# Patient Record
Sex: Male | Born: 1985 | Race: Black or African American | Hispanic: No | Marital: Single | Smoking: Current every day smoker
Health system: Southern US, Community
[De-identification: ages and names within clinical notes are randomized; demographics above are authoritative.]

## PROBLEM LIST (undated history)

## (undated) HISTORY — PX: HERNIA REPAIR: SHX51

## (undated) HISTORY — PX: APPENDECTOMY: SHX54

---
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
## 2006-08-24 ENCOUNTER — Emergency Department (HOSPITAL_COMMUNITY): Admission: EM | Admit: 2006-08-24 | Discharge: 2006-08-24 | Payer: Self-pay | Admitting: Emergency Medicine

## 2006-11-30 ENCOUNTER — Emergency Department (HOSPITAL_COMMUNITY): Admission: EM | Admit: 2006-11-30 | Discharge: 2006-11-30 | Payer: Self-pay | Admitting: Emergency Medicine

## 2006-12-04 ENCOUNTER — Ambulatory Visit (HOSPITAL_COMMUNITY): Admission: RE | Admit: 2006-12-04 | Discharge: 2006-12-04 | Payer: Self-pay | Admitting: Family Medicine

## 2007-08-24 ENCOUNTER — Emergency Department (HOSPITAL_COMMUNITY): Admission: EM | Admit: 2007-08-24 | Discharge: 2007-08-24 | Payer: Self-pay | Admitting: Emergency Medicine

## 2011-08-31 ENCOUNTER — Encounter: Payer: Self-pay | Admitting: Gastroenterology

## 2011-09-26 ENCOUNTER — Ambulatory Visit: Payer: Self-pay | Admitting: Gastroenterology

## 2012-04-11 ENCOUNTER — Emergency Department (HOSPITAL_COMMUNITY): Payer: No Typology Code available for payment source

## 2012-04-11 ENCOUNTER — Encounter (HOSPITAL_COMMUNITY): Payer: Self-pay | Admitting: *Deleted

## 2012-04-11 ENCOUNTER — Emergency Department (HOSPITAL_COMMUNITY)
Admission: EM | Admit: 2012-04-11 | Discharge: 2012-04-11 | Disposition: A | Discharge status: Home / Self Care | Payer: No Typology Code available for payment source | Attending: Emergency Medicine | Admitting: Emergency Medicine

## 2012-04-11 DIAGNOSIS — S8010XA Contusion of unspecified lower leg, initial encounter: Secondary | ICD-10-CM

## 2012-04-11 DIAGNOSIS — F17200 Nicotine dependence, unspecified, uncomplicated: Secondary | ICD-10-CM | POA: Insufficient documentation

## 2012-04-11 DIAGNOSIS — Y93I9 Activity, other involving external motion: Secondary | ICD-10-CM | POA: Insufficient documentation

## 2012-04-11 DIAGNOSIS — Y92410 Unspecified street and highway as the place of occurrence of the external cause: Secondary | ICD-10-CM | POA: Insufficient documentation

## 2012-04-11 DIAGNOSIS — S8990XA Unspecified injury of unspecified lower leg, initial encounter: Secondary | ICD-10-CM | POA: Insufficient documentation

## 2012-04-11 DIAGNOSIS — S139XXA Sprain of joints and ligaments of unspecified parts of neck, initial encounter: Secondary | ICD-10-CM | POA: Insufficient documentation

## 2012-04-11 DIAGNOSIS — S161XXA Strain of muscle, fascia and tendon at neck level, initial encounter: Secondary | ICD-10-CM

## 2012-04-11 MED ORDER — HYDROCODONE-ACETAMINOPHEN 5-325 MG PO TABS: 1.0000 | ORAL_TABLET | ORAL | Status: DC | PRN

## 2012-04-11 MED ORDER — IBUPROFEN 800 MG PO TABS
800.0000 mg | ORAL_TABLET | Freq: Once | ORAL | Status: AC
  Administered 2012-04-11: 800 mg via ORAL
  Filled 2012-04-11: qty 1
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
## 2012-04-11 NOTE — ED Notes (Signed)
Patient with no complaints at this time. Respirations even and unlabored. Skin warm/dry. Discharge instructions reviewed with patient at this time. Patient given opportunity to voice concerns/ask questions. Patient discharged at this time and left Emergency Department with steady gait.   
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
## 2012-05-04 ENCOUNTER — Emergency Department (HOSPITAL_COMMUNITY)
Admission: EM | Admit: 2012-05-04 | Discharge: 2012-05-04 | Disposition: A | Discharge status: Home / Self Care | Payer: No Typology Code available for payment source | Attending: Emergency Medicine | Admitting: Emergency Medicine

## 2012-05-04 ENCOUNTER — Emergency Department (HOSPITAL_COMMUNITY): Payer: No Typology Code available for payment source

## 2012-05-04 ENCOUNTER — Encounter (HOSPITAL_COMMUNITY): Payer: Self-pay | Admitting: *Deleted

## 2012-05-04 DIAGNOSIS — S335XXA Sprain of ligaments of lumbar spine, initial encounter: Secondary | ICD-10-CM | POA: Insufficient documentation

## 2012-05-04 DIAGNOSIS — N50819 Testicular pain, unspecified: Secondary | ICD-10-CM

## 2012-05-04 DIAGNOSIS — Y92410 Unspecified street and highway as the place of occurrence of the external cause: Secondary | ICD-10-CM | POA: Insufficient documentation

## 2012-05-04 DIAGNOSIS — N5089 Other specified disorders of the male genital organs: Secondary | ICD-10-CM | POA: Insufficient documentation

## 2012-05-04 DIAGNOSIS — F17200 Nicotine dependence, unspecified, uncomplicated: Secondary | ICD-10-CM | POA: Insufficient documentation

## 2012-05-04 DIAGNOSIS — M79609 Pain in unspecified limb: Secondary | ICD-10-CM | POA: Insufficient documentation

## 2012-05-04 DIAGNOSIS — S39012A Strain of muscle, fascia and tendon of lower back, initial encounter: Secondary | ICD-10-CM

## 2012-05-04 DIAGNOSIS — N509 Disorder of male genital organs, unspecified: Secondary | ICD-10-CM | POA: Insufficient documentation

## 2012-05-04 DIAGNOSIS — Y939 Activity, unspecified: Secondary | ICD-10-CM | POA: Insufficient documentation

## 2012-05-04 MED ORDER — TRAMADOL HCL 50 MG PO TABS: 50.0000 mg | ORAL_TABLET | Freq: Four times a day (QID) | ORAL | Status: DC | PRN

## 2012-05-04 MED ORDER — IBUPROFEN 800 MG PO TABS: 800.0000 mg | ORAL_TABLET | Freq: Three times a day (TID) | ORAL | Status: DC

## 2012-05-04 MED ORDER — SULFAMETHOXAZOLE-TRIMETHOPRIM 800-160 MG PO TABS: 1.0000 | ORAL_TABLET | Freq: Two times a day (BID) | ORAL | Status: DC
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
## 2013-04-02 ENCOUNTER — Encounter (HOSPITAL_COMMUNITY)
Admission: EM | Disposition: A | Discharge status: Home / Self Care | Payer: Self-pay | Source: Home / Self Care | Attending: Emergency Medicine

## 2013-04-02 ENCOUNTER — Emergency Department (HOSPITAL_COMMUNITY): Payer: Self-pay

## 2013-04-02 ENCOUNTER — Emergency Department (HOSPITAL_COMMUNITY): Payer: Self-pay | Admitting: Anesthesiology

## 2013-04-02 ENCOUNTER — Encounter (HOSPITAL_COMMUNITY): Payer: Self-pay | Admitting: Emergency Medicine

## 2013-04-02 ENCOUNTER — Observation Stay (HOSPITAL_COMMUNITY)
Admission: EM | Admit: 2013-04-02 | Discharge: 2013-04-03 | Disposition: A | Discharge status: Home / Self Care | Payer: Self-pay | Attending: General Surgery | Admitting: General Surgery

## 2013-04-02 ENCOUNTER — Encounter (HOSPITAL_COMMUNITY): Payer: Self-pay | Admitting: Anesthesiology

## 2013-04-02 DIAGNOSIS — K3580 Unspecified acute appendicitis: Principal | ICD-10-CM | POA: Diagnosis present

## 2013-04-02 DIAGNOSIS — R109 Unspecified abdominal pain: Secondary | ICD-10-CM

## 2013-04-02 DIAGNOSIS — R1110 Vomiting, unspecified: Secondary | ICD-10-CM

## 2013-04-02 DIAGNOSIS — Z01812 Encounter for preprocedural laboratory examination: Secondary | ICD-10-CM | POA: Insufficient documentation

## 2013-04-02 HISTORY — PX: LAPAROSCOPIC APPENDECTOMY: SHX408

## 2013-04-02 LAB — COMPREHENSIVE METABOLIC PANEL
ALT: 20 U/L (ref 0–53)
AST: 26 U/L (ref 0–37)
Alkaline Phosphatase: 77 U/L (ref 39–117)
CO2: 25 mEq/L (ref 19–32)
Calcium: 10.3 mg/dL (ref 8.4–10.5)
GFR calc non Af Amer: >90 mL/min (ref >90)
Potassium: 3.5 mEq/L (ref 3.5–5.1)
Sodium: 140 mEq/L (ref 135–145)

## 2013-04-02 LAB — CBC WITH DIFFERENTIAL/PLATELET
Basophils Absolute: 0 10*3/uL (ref 0.0–0.1)
Eosinophils Relative: 1 % (ref 0–5)
Lymphocytes Relative: 9 % — ABNORMAL LOW (ref 12–46)
Neutro Abs: 11.6 10*3/uL — ABNORMAL HIGH (ref 1.7–7.7)
Neutrophils Relative %: 82 % — ABNORMAL HIGH (ref 43–77)
Platelets: 194 10*3/uL (ref 150–400)
RBC: 4.4 MIL/uL (ref 4.22–5.81)
RDW: 13.4 % (ref 11.5–15.5)
WBC: 14.1 10*3/uL — ABNORMAL HIGH (ref 4.0–10.5)

## 2013-04-02 LAB — URINALYSIS, ROUTINE W REFLEX MICROSCOPIC
Glucose, UA: NEGATIVE mg/dL
Hgb urine dipstick: NEGATIVE
Protein, ur: NEGATIVE mg/dL
Specific Gravity, Urine: 1.02 (ref 1.005–1.030)

## 2013-04-02 SURGERY — APPENDECTOMY, LAPAROSCOPIC
Anesthesia: General | Site: Abdomen | Wound class: Dirty or Infected

## 2013-04-02 MED ORDER — ONDANSETRON HCL 4 MG/2ML IJ SOLN
4.0000 mg | Freq: Once | INTRAMUSCULAR | Status: AC
  Administered 2013-04-02: 4 mg via INTRAVENOUS
  Filled 2013-04-02: qty 2

## 2013-04-02 MED ORDER — PROPOFOL 10 MG/ML IV BOLUS
INTRAVENOUS | Status: DC | PRN
  Administered 2013-04-02: 25 mg via INTRAVENOUS
  Administered 2013-04-02: 175 mg via INTRAVENOUS

## 2013-04-02 MED ORDER — DEXTROSE 5 % IV SOLN: 2.0000 g | INTRAVENOUS | Status: DC

## 2013-04-02 MED ORDER — NEOSTIGMINE METHYLSULFATE 1 MG/ML IJ SOLN
INTRAMUSCULAR | Status: DC | PRN
  Administered 2013-04-02: 3 mg via INTRAVENOUS

## 2013-04-02 MED ORDER — SODIUM CHLORIDE 0.9 % IJ SOLN
INTRAMUSCULAR | Status: AC
  Filled 2013-04-02: qty 500

## 2013-04-02 MED ORDER — OXYCODONE-ACETAMINOPHEN 5-325 MG PO TABS
1.0000 | ORAL_TABLET | ORAL | Status: DC | PRN
  Administered 2013-04-02 – 2013-04-03 (×3): 2 via ORAL
  Filled 2013-04-02 (×3): qty 2

## 2013-04-02 MED ORDER — BUPIVACAINE HCL (PF) 0.5 % IJ SOLN
INTRAMUSCULAR | Status: DC | PRN
  Administered 2013-04-02: 10 mL

## 2013-04-02 MED ORDER — MIDAZOLAM HCL 2 MG/2ML IJ SOLN
INTRAMUSCULAR | Status: AC
  Filled 2013-04-02: qty 2

## 2013-04-02 MED ORDER — SODIUM CHLORIDE 0.9 % IV BOLUS (SEPSIS)
1000.0000 mL | Freq: Once | INTRAVENOUS | Status: AC
  Administered 2013-04-02: 1000 mL via INTRAVENOUS

## 2013-04-02 MED ORDER — DEXTROSE 5 % IV SOLN
1.0000 g | Freq: Two times a day (BID) | INTRAVENOUS | Status: DC
  Administered 2013-04-02 – 2013-04-03 (×2): 1 g via INTRAVENOUS
  Filled 2013-04-02 (×4): qty 1

## 2013-04-02 MED ORDER — HYDROMORPHONE HCL PF 1 MG/ML IJ SOLN: 1.0000 mg | INTRAMUSCULAR | Status: DC | PRN

## 2013-04-02 MED ORDER — ONDANSETRON HCL 4 MG PO TABS: 4.0000 mg | ORAL_TABLET | Freq: Four times a day (QID) | ORAL | Status: DC | PRN

## 2013-04-02 MED ORDER — ROCURONIUM BROMIDE 100 MG/10ML IV SOLN
INTRAVENOUS | Status: DC | PRN
  Administered 2013-04-02: 25 mg via INTRAVENOUS
  Administered 2013-04-02: 5 mg via INTRAVENOUS

## 2013-04-02 MED ORDER — SODIUM CHLORIDE 0.9 % IR SOLN
Status: DC | PRN
  Administered 2013-04-02: 1000 mL

## 2013-04-02 MED ORDER — SUCCINYLCHOLINE CHLORIDE 20 MG/ML IJ SOLN
INTRAMUSCULAR | Status: AC
  Filled 2013-04-02: qty 1

## 2013-04-02 MED ORDER — ONDANSETRON HCL 4 MG/2ML IJ SOLN: 4.0000 mg | Freq: Four times a day (QID) | INTRAMUSCULAR | Status: DC | PRN

## 2013-04-02 MED ORDER — METOCLOPRAMIDE HCL 5 MG/ML IJ SOLN
10.0000 mg | Freq: Once | INTRAMUSCULAR | Status: AC
  Administered 2013-04-02: 10 mg via INTRAVENOUS
  Filled 2013-04-02: qty 2

## 2013-04-02 MED ORDER — ONDANSETRON HCL 4 MG/2ML IJ SOLN
4.0000 mg | Freq: Once | INTRAMUSCULAR | Status: AC
  Administered 2013-04-02: 4 mg via INTRAMUSCULAR
  Filled 2013-04-02: qty 2

## 2013-04-02 MED ORDER — GLYCOPYRROLATE 0.2 MG/ML IJ SOLN
INTRAMUSCULAR | Status: DC | PRN
  Administered 2013-04-02: .6 mg via INTRAVENOUS

## 2013-04-02 MED ORDER — IOHEXOL 300 MG/ML  SOLN
50.0000 mL | Freq: Once | INTRAMUSCULAR | Status: AC | PRN
  Administered 2013-04-02: 50 mL via ORAL

## 2013-04-02 MED ORDER — PROPOFOL 10 MG/ML IV EMUL
INTRAVENOUS | Status: AC
  Filled 2013-04-02: qty 20

## 2013-04-02 MED ORDER — ENOXAPARIN SODIUM 40 MG/0.4ML ~~LOC~~ SOLN
40.0000 mg | SUBCUTANEOUS | Status: DC
  Administered 2013-04-02: 40 mg via SUBCUTANEOUS
  Filled 2013-04-02: qty 0.4

## 2013-04-02 MED ORDER — FENTANYL CITRATE 0.05 MG/ML IJ SOLN
INTRAMUSCULAR | Status: AC
  Filled 2013-04-02: qty 5

## 2013-04-02 MED ORDER — FENTANYL CITRATE 0.05 MG/ML IJ SOLN
INTRAMUSCULAR | Status: DC | PRN
  Administered 2013-04-02: 50 ug via INTRAVENOUS
  Administered 2013-04-02: 100 ug via INTRAVENOUS
  Administered 2013-04-02 (×2): 25 ug via INTRAVENOUS
  Administered 2013-04-02 (×5): 50 ug via INTRAVENOUS

## 2013-04-02 MED ORDER — LACTATED RINGERS IV SOLN
INTRAVENOUS | Status: DC
  Administered 2013-04-02 (×2): via INTRAVENOUS

## 2013-04-02 MED ORDER — DEXTROSE 5 % IV SOLN
2.0000 g | INTRAVENOUS | Status: AC
  Administered 2013-04-02: 2 g via INTRAVENOUS
  Filled 2013-04-02: qty 2

## 2013-04-02 MED ORDER — PROMETHAZINE HCL 25 MG RE SUPP: 25.0000 mg | Freq: Four times a day (QID) | RECTAL | Status: DC | PRN

## 2013-04-02 MED ORDER — ROCURONIUM BROMIDE 50 MG/5ML IV SOLN
INTRAVENOUS | Status: AC
  Filled 2013-04-02: qty 1

## 2013-04-02 MED ORDER — NICOTINE 21 MG/24HR TD PT24
21.0000 mg | MEDICATED_PATCH | TRANSDERMAL | Status: DC
  Administered 2013-04-02: 21 mg via TRANSDERMAL
  Filled 2013-04-02: qty 1

## 2013-04-02 MED ORDER — POVIDONE-IODINE 10 % EX OINT
TOPICAL_OINTMENT | CUTANEOUS | Status: AC
  Filled 2013-04-02: qty 1

## 2013-04-02 MED ORDER — LIDOCAINE HCL (PF) 1 % IJ SOLN
INTRAMUSCULAR | Status: AC
  Filled 2013-04-02: qty 5

## 2013-04-02 MED ORDER — LACTATED RINGERS IV SOLN
INTRAVENOUS | Status: DC | PRN
  Administered 2013-04-02: 18:00:00 via INTRAVENOUS

## 2013-04-02 MED ORDER — KETOROLAC TROMETHAMINE 30 MG/ML IJ SOLN
30.0000 mg | Freq: Once | INTRAMUSCULAR | Status: AC
  Administered 2013-04-02: 30 mg via INTRAVENOUS

## 2013-04-02 MED ORDER — BUPIVACAINE HCL (PF) 0.5 % IJ SOLN
INTRAMUSCULAR | Status: AC
  Filled 2013-04-02: qty 30

## 2013-04-02 MED ORDER — TRAMADOL HCL 50 MG PO TABS: 50.0000 mg | ORAL_TABLET | Freq: Four times a day (QID) | ORAL | Status: DC | PRN

## 2013-04-02 MED ORDER — FENTANYL CITRATE 0.05 MG/ML IJ SOLN
INTRAMUSCULAR | Status: AC
  Filled 2013-04-02: qty 2

## 2013-04-02 MED ORDER — MIDAZOLAM HCL 5 MG/5ML IJ SOLN
INTRAMUSCULAR | Status: DC | PRN
  Administered 2013-04-02: 2 mg via INTRAVENOUS

## 2013-04-02 MED ORDER — FENTANYL CITRATE 0.05 MG/ML IJ SOLN
INTRAMUSCULAR | Status: AC
Start: 2013-04-02 — End: 2013-04-02
  Filled 2013-04-02: qty 2

## 2013-04-02 MED ORDER — MORPHINE SULFATE 4 MG/ML IJ SOLN
4.0000 mg | Freq: Once | INTRAMUSCULAR | Status: AC
  Administered 2013-04-02: 4 mg via INTRAVENOUS
  Filled 2013-04-02: qty 1

## 2013-04-02 MED ORDER — SUCCINYLCHOLINE CHLORIDE 20 MG/ML IJ SOLN
INTRAMUSCULAR | Status: DC | PRN
  Administered 2013-04-02: 120 mg via INTRAVENOUS

## 2013-04-02 MED ORDER — IOHEXOL 300 MG/ML  SOLN
100.0000 mL | Freq: Once | INTRAMUSCULAR | Status: AC | PRN
  Administered 2013-04-02: 100 mL via INTRAVENOUS

## 2013-04-02 MED ORDER — GLYCOPYRROLATE 0.2 MG/ML IJ SOLN
INTRAMUSCULAR | Status: AC
  Filled 2013-04-02: qty 3

## 2013-04-02 MED ORDER — KETOROLAC TROMETHAMINE 30 MG/ML IJ SOLN
INTRAMUSCULAR | Status: AC
  Filled 2013-04-02: qty 1

## 2013-04-02 MED ORDER — LIDOCAINE HCL 1 % IJ SOLN
INTRAMUSCULAR | Status: DC | PRN
  Administered 2013-04-02 (×2): 50 mg via INTRADERMAL

## 2013-04-02 SURGICAL SUPPLY — 38 items
BAG HAMPER (MISCELLANEOUS) ×2 IMPLANT
CLOTH BEACON ORANGE TIMEOUT ST (SAFETY) ×2 IMPLANT
COVER LIGHT HANDLE STERIS (MISCELLANEOUS) ×4 IMPLANT
CUTTER LINEAR ENDO 35 ETS (STAPLE) ×2 IMPLANT
DECANTER SPIKE VIAL GLASS SM (MISCELLANEOUS) ×2 IMPLANT
DURAPREP 26ML APPLICATOR (WOUND CARE) ×2 IMPLANT
ELECT REM PT RETURN 9FT ADLT (ELECTROSURGICAL) ×2
ELECTRODE REM PT RTRN 9FT ADLT (ELECTROSURGICAL) ×1 IMPLANT
FILTER SMOKE EVAC LAPAROSHD (FILTER) ×2 IMPLANT
FORMALIN 10 PREFIL 120ML (MISCELLANEOUS) ×2 IMPLANT
GLOVE BIO SURGEON STRL SZ7.5 (GLOVE) ×2 IMPLANT
GLOVE BIOGEL PI IND STRL 7.5 (GLOVE) ×3 IMPLANT
GLOVE BIOGEL PI IND STRL 8 (GLOVE) ×1 IMPLANT
GLOVE BIOGEL PI INDICATOR 7.5 (GLOVE) ×3
GLOVE BIOGEL PI INDICATOR 8 (GLOVE) ×1
GLOVE ECLIPSE 7.0 STRL STRAW (GLOVE) ×2 IMPLANT
GOWN STRL REIN XL XLG (GOWN DISPOSABLE) ×4 IMPLANT
INST SET LAPROSCOPIC AP (KITS) ×2 IMPLANT
KIT ROOM TURNOVER APOR (KITS) ×2 IMPLANT
MANIFOLD NEPTUNE II (INSTRUMENTS) ×2 IMPLANT
NEEDLE INSUFFLATION 14GA 120MM (NEEDLE) ×2 IMPLANT
NS IRRIG 1000ML POUR BTL (IV SOLUTION) ×2 IMPLANT
PACK LAP CHOLE LZT030E (CUSTOM PROCEDURE TRAY) ×2 IMPLANT
PAD ARMBOARD 7.5X6 YLW CONV (MISCELLANEOUS) ×2 IMPLANT
PENCIL HANDSWITCHING (ELECTRODE) ×2 IMPLANT
POUCH SPECIMEN RETRIEVAL 10MM (ENDOMECHANICALS) ×2 IMPLANT
SCALPEL HARMONIC ACE (MISCELLANEOUS) ×2 IMPLANT
SET BASIN LINEN APH (SET/KITS/TRAYS/PACK) ×2 IMPLANT
SPONGE GAUZE 2X2 8PLY STRL LF (GAUZE/BANDAGES/DRESSINGS) ×8 IMPLANT
STAPLER VISISTAT (STAPLE) ×2 IMPLANT
SUT VICRYL 0 UR6 27IN ABS (SUTURE) ×2 IMPLANT
TAPE CLOTH SURG 4X10 WHT LF (GAUZE/BANDAGES/DRESSINGS) ×2 IMPLANT
TROCAR ENDO BLADELESS 11MM (ENDOMECHANICALS) ×2 IMPLANT
TROCAR ENDO BLADELESS 12MM (ENDOMECHANICALS) ×2 IMPLANT
TROCAR XCEL NON-BLD 5MMX100MML (ENDOMECHANICALS) ×2 IMPLANT
TUBING INSUFFLATION (TUBING) ×2 IMPLANT
WARMER LAPAROSCOPE (MISCELLANEOUS) ×2 IMPLANT
YANKAUER SUCT 12FT TUBE ARGYLE (SUCTIONS) ×2 IMPLANT
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
## 2013-04-02 NOTE — ED Notes (Signed)
Pt taken to OR.

## 2013-04-02 NOTE — Anesthesia Preprocedure Evaluation (Signed)
Anesthesia Evaluation  Patient identified by MRN, date of birth, ID band Patient awake    Reviewed: Allergy & Precautions, H&P , NPO status , Patient's Chart, lab work & pertinent test results  History of Anesthesia Complications Negative for: history of anesthetic complications  Airway Mallampati: I TM Distance: >3 FB     Dental  (+) Teeth Intact   Pulmonary Current Smoker,          Cardiovascular negative cardio ROS  Rhythm:Regular     Neuro/Psych    GI/Hepatic negative GI ROS, Neg liver ROS,   Endo/Other  negative endocrine ROS  Renal/GU negative Renal ROS     Musculoskeletal   Abdominal   Peds  Hematology negative hematology ROS (+)   Anesthesia Other Findings   Reproductive/Obstetrics                           Anesthesia Physical Anesthesia Plan  ASA: I and emergent  Anesthesia Plan: General   Post-op Pain Management:    Induction: Intravenous, Rapid sequence and Cricoid pressure planned  Airway Management Planned: Oral ETT  Additional Equipment:   Intra-op Plan:   Post-operative Plan: Extubation in OR  Informed Consent: I have reviewed the patients History and Physical, chart, labs and discussed the procedure including the risks, benefits and alternatives for the proposed anesthesia with the patient or authorized representative who has indicated his/her understanding and acceptance.   Dental advisory given  Plan Discussed with: Anesthesiologist  Anesthesia Plan Comments:         Anesthesia Quick Evaluation

## 2013-04-02 NOTE — ED Notes (Signed)
Pt complains of dizziness, thirst and nausea associated with abdominal pain

## 2013-04-02 NOTE — ED Notes (Signed)
Pt still c/o dizziness, mild nausea and abd pain

## 2013-04-02 NOTE — ED Provider Notes (Signed)
CSN: [id number]     Arrival date & time 04/02/13  1301 History   First MD Initiated Contact with Patient 04/02/13 1309     Chief Complaint  Patient presents with  . Abdominal Pain   (Consider location/radiation/quality/duration/timing/severity/associated sxs/prior Treatment) HPI Comments: Patient presents to the ER for evaluation of abdominal pain. Pain is moderate and constant. No alleviating or exacerbating factors. Patient reports that he woke this morning with midabdominal discomfort, nausea and vomiting. He has not had any diarrhea. Patient denies fever.  Patient is a 27 y.o. male presenting with abdominal pain.  Abdominal Pain Associated symptoms: vomiting     History reviewed. No pertinent past medical history. Past Surgical History  Procedure Laterality Date  . Hernia repair     History reviewed. No pertinent family history. History  Substance Use Topics  . Smoking status: Current Every Day Smoker -- 0.50 packs/day for 3 years    Types: Cigarettes  . Smokeless tobacco: Not on file  . Alcohol Use: Yes     Comment: occassionally     Review of Systems  Gastrointestinal: Positive for vomiting and abdominal pain.  All other systems reviewed and are negative.    Allergies  Review of patient's allergies indicates no known allergies.  Home Medications  No current outpatient prescriptions on file. BP 153/96  Pulse 88  Temp(Src) 98.5 F (36.9 C) (Oral)  Resp 20  Ht 6' (1.829 m)  Wt 170 lb (77.111 kg)  BMI 23.05 kg/m2  SpO2 99% Physical Exam  Constitutional: He is oriented to person, place, and time. He appears well-developed and well-nourished. No distress.  HENT:  Head: Normocephalic and atraumatic.  Right Ear: Hearing normal.  Left Ear: Hearing normal.  Nose: Nose normal.  Mouth/Throat: Oropharynx is clear and moist and mucous membranes are normal.  Eyes: Conjunctivae and EOM are normal. Pupils are equal, round, and reactive to light.  Neck: Normal range of  motion. Neck supple.  Cardiovascular: Regular rhythm, S1 normal and S2 normal.  Exam reveals no gallop and no friction rub.   No murmur heard. Pulmonary/Chest: Effort normal and breath sounds normal. No respiratory distress. He exhibits no tenderness.  Abdominal: Soft. Normal appearance and bowel sounds are normal. There is no hepatosplenomegaly. There is generalized tenderness. There is no rebound, no guarding, no tenderness at McBurney's point and negative Murphy's sign. No hernia.    No lower abdominal tenderness - no tenderness at McBurney's point  Musculoskeletal: Normal range of motion.  Neurological: He is alert and oriented to person, place, and time. He has normal strength. No cranial nerve deficit or sensory deficit. Coordination normal. GCS eye subscore is 4. GCS verbal subscore is 5. GCS motor subscore is 6.  Skin: Skin is warm, dry and intact. No rash noted. No cyanosis.  Psychiatric: He has a normal mood and affect. His speech is normal and behavior is normal. Thought content normal.    ED Course  Procedures (including critical care time) Labs Review Labs Reviewed  CBC WITH DIFFERENTIAL - Abnormal; Notable for the following:    WBC 14.1 (*)    Neutrophils Relative % 82 (*)    Neutro Abs 11.6 (*)    Lymphocytes Relative 9 (*)    Monocytes Absolute 1.1 (*)    All other components within normal limits  COMPREHENSIVE METABOLIC PANEL - Abnormal; Notable for the following:    Glucose, Bld 101 (*)    All other components within normal limits  LIPASE, BLOOD  URINALYSIS, ROUTINE W  REFLEX MICROSCOPIC   Imaging Review No results found.  EKG Interpretation   None       MDM  Diagnosis: Abdominal pain  Patient presents to the ER for evaluation of abdominal pain. Patient indicates that he is having diffuse abdominal pain, tenderness across the mid section. Patient does have a moderate leukocytosis. Abdomen is benign, examination. There is mild and diffuse tenderness, no  guarding or rebound. CAT scan was performed because of the patient's tenderness and leukocytosis, specifically to rule out appendicitis.  Performed and does have evidence of early acute appendicitis without complication. Repeat examination the patient has some increased tenderness in the right lower quadrant which was not present earlier. General surgery consult for acute appendicitis.    Gilda Crease, MD 04/02/13 [phone]

## 2013-04-02 NOTE — Transfer of Care (Signed)
Immediate Anesthesia Transfer of Care Note  Patient: Ryan Larsen  Procedure(s) Performed: Procedure(s): APPENDECTOMY LAPAROSCOPIC (N/A)  Patient Location: PACU  Anesthesia Type:General  Level of Consciousness: awake, alert  and oriented  Airway & Oxygen Therapy: Patient Spontanous Breathing and Patient connected to face mask oxygen  Post-op Assessment: Report given to PACU RN  Post vital signs: Reviewed and stable  Complications: No apparent anesthesia complications

## 2013-04-02 NOTE — Op Note (Signed)
Patient:  Ryan Larsen  DOB:  15-Oct-1985  MRN:  [id number]   Preop Diagnosis:  Acute appendicitis  Postop Diagnosis:  Same  Procedure:  Laparoscopic appendectomy  Surgeon:  Franky Macho, M.D.  Anes:  General endotracheal  Indications:  Patient is a 27 year old black male who presents with right lower quadrant abdominal pain. CT scan of the abdomen reveals acute appendicitis. The risks and benefits of the procedure including bleeding, infection, and the possibility of an open procedure were fully explained to the patient, who gave informed consent.  Procedure note:  The patient was placed the supine position. After induction of general endotracheal anesthesia, the abdomen was prepped and draped using usual sterile technique with DuraPrep. Surgical site confirmation was performed.  A supraumbilical incision was made down to the fascia. A Veress needle was introduced into the abdominal cavity and confirmation of placement was done using the saline drop test. The abdomen was then insufflated to 16 mm mercury pressure. An 11 mm trocar was introduced into the abdominal cavity under direct visualization without difficulty. Patient was placed in deeper Trendelenburg position and additional 12 mm trocar was placed the suprapubic region and a 5 mm trocar was placed left lower quadrant region. The appendix was visualized and noted be diffusely inflamed with some exudate present on the serosa. No evidence of perforation was noted. The cecal wall appeared to be within normal limits. The terminal ileum was within normal limits. The mesoappendix was divided using the harmonic scalpel. A vascular Endo GIA was placed across the base the appendix and fired. The appendix was then removed using an Endo Catch bag without difficulty. The staple line was inspected and noted within normal limits. All fluid and air were then evacuated from the abdominal cavity prior to removal of the trochars.  All wounds were  irrigated with normal saline. All wounds were checked with 0.5% Sensorcaine. The supraumbilical fascia as well as suprapubic fascia were reapproximated using 0 Vicryl interrupted sutures. All skin incisions were closed using Larsen. Betadine ointment and dry sterile dressings were applied.  All tape and needle counts were correct at the end of the procedure. Patient was extubated in the operating room and transferred to PACU in stable condition.    Complications:  None  EBL:  Minimal  Specimen:  Appendix

## 2013-04-02 NOTE — H&P (Signed)
Ryan Larsen is an 27 y.o. male.   Chief Complaint: Right lower quadrant abdominal pain HPI: Patient is a 27 year old black male who states he has not felt well for the past few days but started having right lower corner abdominal pain earlier today. CT scan the abdomen reveals acute appendicitis.  History reviewed. No pertinent past medical history.  Past Surgical History  Procedure Laterality Date  . Hernia repair      History reviewed. No pertinent family history. Social History:  reports that he has been smoking Cigarettes.  He has a 1.5 pack-year smoking history. He does not have any smokeless tobacco history on file. He reports that he drinks alcohol. He reports that he does not use illicit drugs.  Allergies: No Known Allergies   (Not in a hospital admission)  Results for orders placed during the hospital encounter of 04/02/13 (from the past 48 hour(s))  CBC WITH DIFFERENTIAL     Status: Abnormal   Collection Time    04/02/13  2:16 PM      Result Value Range   WBC 14.1 (*) 4.0 - 10.5 K/uL   RBC 4.40  4.22 - 5.81 MIL/uL   Hemoglobin 13.4  13.0 - 17.0 g/dL   HCT 40.9  81.1 - 91.4 %   MCV 90.0  78.0 - 100.0 fL   MCH 30.5  26.0 - 34.0 pg   MCHC 33.8  30.0 - 36.0 g/dL   RDW 78.2  95.6 - 21.3 %   Platelets 194  150 - 400 K/uL   Neutrophils Relative % 82 (*) 43 - 77 %   Neutro Abs 11.6 (*) 1.7 - 7.7 K/uL   Lymphocytes Relative 9 (*) 12 - 46 %   Lymphs Abs 1.3  0.7 - 4.0 K/uL   Monocytes Relative 8  3 - 12 %   Monocytes Absolute 1.1 (*) 0.1 - 1.0 K/uL   Eosinophils Relative 1  0 - 5 %   Eosinophils Absolute 0.1  0.0 - 0.7 K/uL   Basophils Relative 0  0 - 1 %   Basophils Absolute 0.0  0.0 - 0.1 K/uL  COMPREHENSIVE METABOLIC PANEL     Status: Abnormal   Collection Time    04/02/13  2:16 PM      Result Value Range   Sodium 140  135 - 145 mEq/L   Potassium 3.5  3.5 - 5.1 mEq/L   Chloride 105  96 - 112 mEq/L   CO2 25  19 - 32 mEq/L   Glucose, Bld 101 (*) 70 - 99  mg/dL   BUN 12  6 - 23 mg/dL   Creatinine, Ser 0.86  0.50 - 1.35 mg/dL   Calcium 57.8  8.4 - 46.9 mg/dL   Total Protein 8.1  6.0 - 8.3 g/dL   Albumin 4.3  3.5 - 5.2 g/dL   AST 26  0 - 37 U/L   ALT 20  0 - 53 U/L   Alkaline Phosphatase 77  39 - 117 U/L   Total Bilirubin 0.6  0.3 - 1.2 mg/dL   GFR calc non Af Amer >90  >90 mL/min   GFR calc Af Amer >90  >90 mL/min   Comment: (NOTE)     The eGFR has been calculated using the CKD EPI equation.     This calculation has not been validated in all clinical situations.     eGFR's persistently <90 mL/min signify possible Chronic Kidney     Disease.  LIPASE, BLOOD  Status: None   Collection Time    04/02/13  2:16 PM      Result Value Range   Lipase 19  11 - 59 U/L  URINALYSIS, ROUTINE W REFLEX MICROSCOPIC     Status: None   Collection Time    04/02/13  2:53 PM      Result Value Range   Color, Urine YELLOW  YELLOW   APPearance CLEAR  CLEAR   Specific Gravity, Urine 1.020  1.005 - 1.030   pH 7.0  5.0 - 8.0   Glucose, UA NEGATIVE  NEGATIVE mg/dL   Hgb urine dipstick NEGATIVE  NEGATIVE   Bilirubin Urine NEGATIVE  NEGATIVE   Ketones, ur NEGATIVE  NEGATIVE mg/dL   Protein, ur NEGATIVE  NEGATIVE mg/dL   Urobilinogen, UA 0.2  0.0 - 1.0 mg/dL   Nitrite NEGATIVE  NEGATIVE   Leukocytes, UA NEGATIVE  NEGATIVE   Comment: MICROSCOPIC NOT DONE ON URINES WITH NEGATIVE PROTEIN, BLOOD, LEUKOCYTES, NITRITE, OR GLUCOSE <1000 mg/dL.   Ct Abdomen Pelvis W Contrast  04/02/2013   CLINICAL DATA:  Abdominal pain, nausea, vomiting  EXAM: CT ABDOMEN AND PELVIS WITH CONTRAST  TECHNIQUE: Multidetector CT imaging of the abdomen and pelvis was performed using the standard protocol following bolus administration of intravenous contrast.  CONTRAST:  50mL OMNIPAQUE IOHEXOL 300 MG/ML SOLN, OMNIPAQUE IOHEXOL 300 MG/ML SOLN  COMPARISON:  None.  FINDINGS: Minor left lower lobe central bronchiectasis with some peripheral mucous plugging noted, appearing chronic and  postinflammatory. No definite superimposed lower lobe pneumonia. Right lung base clear. Normal heart size. No pericardial or pleural effusion.  Abdomen: Liver demonstrates nonspecific diffuse periportal edema. Patent portal vein. No biliary obstruction. No focal hepatic abnormality. Gallbladder, biliary system, pancreas, spleen, adrenal glands, and kidneys are within normal limits for age and demonstrate no acute process.  No abdominal free fluid, fluid collection, abscess, adenopathy, or hemorrhage.  Negative bowel obstruction, dilatation, ileus, or free air.  Pelvis: Appendix is enlarged and ill-defined with periappendiceal haziness. Appendix lumen is fluid distended. Appendix diameters 13 mm. Findings compatible with acute nonruptured appendicitis. There is thickening of the cecum and right colon diffusely, suspect related to mild secondary colitis. Difficult to completely exclude inflammatory bowel disease. Remainder of the colon is relatively collapsed. Terminal ileum unremarkable.  No pelvic fluid collection, abscess, hemorrhage, inguinal abnormality, or hernia. Urinary bladder unremarkable. Pelvic calcifications consistent with venous phleboliths. No acute distal bowel process. Trace pelvic free fluid along the left pelvic sidewall, image 69 likely related to the acute appendicitis  No acute osseous finding.  IMPRESSION: Acute nonruptured appendicitis  Cecum and proximal ascending colon diffuse wall thickening compatible with mild secondary colitis (inflammatory bowel disease could have a similar appearance).  Nonspecific periportal edema throughout the liver  Left lower lobe mild bronchiectasis with some peripheral mucous plugging but no acute pneumonia. Suspect postinflammatory.  Critical Value/emergent results were called by telephone at the time of interpretation on 04/02/2013 at 4:46 PM to Dr.CHRISTOPHER POLLINA , who verbally acknowledged these results.   Electronically Signed   By: Ruel Favors M.D.    On: 04/02/2013 16:47    Review of Systems  Constitutional: Positive for malaise/fatigue.  Gastrointestinal: Positive for abdominal pain.  All other systems reviewed and are negative.    Blood pressure 153/96, pulse 88, temperature 98.5 F (36.9 C), temperature source Oral, resp. rate 20, height 6' (1.829 m), weight 77.111 kg (170 lb), SpO2 99.00%. Physical Exam  Constitutional: He is oriented to person, place, and  time. He appears well-developed and well-nourished.  HENT:  Head: Normocephalic and atraumatic.  Neck: Normal range of motion. Neck supple.  Cardiovascular: Normal rate, regular rhythm and normal heart sounds.   Respiratory: Effort normal and breath sounds normal.  GI: Soft. There is tenderness. There is no rebound.  Tender in the right lower quadrant to palpation. No rigidity noted.  Neurological: He is alert and oriented to person, place, and time.  Skin: Skin is warm and dry.     Assessment/Plan Impression: Acute appendicitis Plan: This will be taken to the operating room for laparoscopic appendectomy. The risks and benefits of the procedure including bleeding, infection, and the possibility of an open procedure were fully explained to the patient, who gave informed consent.  Wave Calzada A 04/02/2013, 5:33 PM

## 2013-04-02 NOTE — Anesthesia Postprocedure Evaluation (Signed)
  Anesthesia Post-op Note  Patient: Ryan Larsen  Procedure(s) Performed: Procedure(s): APPENDECTOMY LAPAROSCOPIC (N/A)  Patient Location: PACU  Anesthesia Type:General  Level of Consciousness: awake, alert  and oriented  Airway and Oxygen Therapy: Patient Spontanous Breathing and Patient connected to face mask oxygen  Post-op Pain: mild  Post-op Assessment: Post-op Vital signs reviewed, Patient's Cardiovascular Status Stable, Respiratory Function Stable, Patent Airway and No signs of Nausea or vomiting  Post-op Vital Signs: Reviewed and stable  Complications: No apparent anesthesia complications

## 2013-04-02 NOTE — Anesthesia Procedure Notes (Signed)
Procedure Name: Intubation Date/Time: 04/02/2013 6:23 PM Performed by: Glynn Octave E Pre-anesthesia Checklist: Patient identified, Patient being monitored, Timeout performed, Emergency Drugs available and Suction available Patient Re-evaluated:Patient Re-evaluated prior to inductionOxygen Delivery Method: Circle System Utilized Preoxygenation: Pre-oxygenation with 100% oxygen Intubation Type: IV induction Ventilation: Mask ventilation without difficulty Laryngoscope Size: Mac and 3 Grade View: Grade I Tube type: Oral Tube size: 7.0 mm Number of attempts: 1 Airway Equipment and Method: stylet Placement Confirmation: ETT inserted through vocal cords under direct vision,  positive ETCO2 and breath sounds checked- equal and bilateral Secured at: 23 cm Tube secured with: Tape Dental Injury: Teeth and Oropharynx as per pre-operative assessment

## 2013-04-03 LAB — CBC
Hemoglobin: 11.8 g/dL — ABNORMAL LOW (ref 13.0–17.0)
MCH: 29.9 pg (ref 26.0–34.0)
MCV: 90.6 fL (ref 78.0–100.0)
RBC: 3.94 MIL/uL — ABNORMAL LOW (ref 4.22–5.81)
WBC: 14.5 10*3/uL — ABNORMAL HIGH (ref 4.0–10.5)

## 2013-04-03 MED ORDER — CIPROFLOXACIN HCL 500 MG PO TABS: 500.0000 mg | ORAL_TABLET | Freq: Two times a day (BID) | ORAL | Status: DC

## 2013-04-03 MED ORDER — OXYCODONE-ACETAMINOPHEN 5-325 MG PO TABS: 1.0000 | ORAL_TABLET | ORAL | Status: DC | PRN

## 2013-04-03 MED ORDER — METRONIDAZOLE 250 MG PO TABS: 250.0000 mg | ORAL_TABLET | Freq: Three times a day (TID) | ORAL | Status: DC

## 2013-04-03 NOTE — Progress Notes (Signed)
Late Entry:  AVS reviewed with patient.  Patient verbalized understanding of discharge instructions, physician follow-up, post-op instructions and medications.  RN verified that [facility] in [place] was open on Thanksgiving.  RN educated patient that [facility] in [place] was open and would be open until 2 p.m.  Patient's IV removed.  Site WNL.  Patient transported by NT via w/c to main entrance for discharge.  Patient stable at time of discharge.

## 2013-04-03 NOTE — Discharge Summary (Signed)
Physician Discharge Summary  Patient ID: Ryan Larsen MRN: [id number] DOB/AGE: 27-30-1987 27 y.o.  Admit date: 04/02/2013 Discharge date: 04/03/2013  Admission Diagnoses: Acute appendicitis  Discharge Diagnoses: Same Active Problems:   Acute appendicitis   Discharged Condition: good  Hospital Course: Patient is a 27 year old black male who presented emergency room with worsening right lower quadrant abdominal pain. CT scan of the abdomen revealed acute appendicitis. He was taken to the operating room on 04/02/2013 and underwent laparoscopic appendectomy. Tolerated the procedure well. His postoperative course has been unremarkable. His diet was advanced without difficulty. He is being discharged home on 04/03/2013 in good improving condition.  Treatments: surgery: Laparoscopic appendectomy on 04/02/2013  Discharge Exam: Blood pressure 101/52, pulse 59, temperature 98.2 F (36.8 C), temperature source Oral, resp. rate 18, height 6' (1.829 m), weight 77.111 kg (170 lb), SpO2 100.00%. General appearance: alert, cooperative and no distress Resp: clear to auscultation bilaterally Cardio: regular rate and rhythm, S1, S2 normal, no murmur, click, rub or gallop GI: Soft, flat. Dressings dry and intact.  Disposition: 01-Home or Self Care     Medication List         ciprofloxacin 500 MG tablet  Commonly known as:  CIPRO  Take 1 tablet (500 mg total) by mouth 2 (two) times daily.     metroNIDAZOLE 250 MG tablet  Commonly known as:  FLAGYL  Take 1 tablet (250 mg total) by mouth 3 (three) times daily.     oxyCODONE-acetaminophen 5-325 MG per tablet  Commonly known as:  PERCOCET/ROXICET  Take 1-2 tablets by mouth every 4 (four) hours as needed for moderate pain.           Follow-up Information   Follow up with Dalia Heading, MD. Schedule an appointment as soon as possible for a visit on 04/10/2013.   Specialty:  General Surgery   Contact information:   [id number] [place] [address] [phone]       Signed: Franky Macho A 04/03/2013, 9:13 AM

## 2013-04-07 ENCOUNTER — Encounter (HOSPITAL_COMMUNITY): Payer: Self-pay | Admitting: General Surgery

## 2013-04-08 NOTE — Progress Notes (Signed)
UR chart review completed.  

## 2013-08-06 ENCOUNTER — Emergency Department (HOSPITAL_COMMUNITY)
Admission: EM | Admit: 2013-08-06 | Discharge: 2013-08-06 | Disposition: A | Discharge status: Home / Self Care | Payer: Self-pay | Attending: Emergency Medicine | Admitting: Emergency Medicine

## 2013-08-06 ENCOUNTER — Encounter (HOSPITAL_COMMUNITY): Payer: Self-pay | Admitting: Emergency Medicine

## 2013-08-06 DIAGNOSIS — R599 Enlarged lymph nodes, unspecified: Secondary | ICD-10-CM | POA: Insufficient documentation

## 2013-08-06 DIAGNOSIS — F17200 Nicotine dependence, unspecified, uncomplicated: Secondary | ICD-10-CM | POA: Insufficient documentation

## 2013-08-06 DIAGNOSIS — R369 Urethral discharge, unspecified: Secondary | ICD-10-CM | POA: Insufficient documentation

## 2013-08-06 DIAGNOSIS — Z79899 Other long term (current) drug therapy: Secondary | ICD-10-CM | POA: Insufficient documentation

## 2013-08-06 DIAGNOSIS — R300 Dysuria: Secondary | ICD-10-CM | POA: Insufficient documentation

## 2013-08-06 LAB — RPR: RPR Ser Ql: NONREACTIVE

## 2013-08-06 LAB — HIV ANTIBODY (ROUTINE TESTING W REFLEX): HIV: NONREACTIVE

## 2013-08-06 MED ORDER — AZITHROMYCIN 250 MG PO TABS
1000.0000 mg | ORAL_TABLET | Freq: Once | ORAL | Status: AC
  Administered 2013-08-06: 1000 mg via ORAL
  Filled 2013-08-06: qty 4

## 2013-08-06 MED ORDER — CEFTRIAXONE SODIUM 250 MG IJ SOLR
250.0000 mg | Freq: Once | INTRAMUSCULAR | Status: AC
  Administered 2013-08-06: 250 mg via INTRAMUSCULAR
  Filled 2013-08-06: qty 250

## 2013-08-06 NOTE — Discharge Instructions (Signed)
Read the information below.  You may return to the Emergency Department at any time for worsening condition or any new symptoms that concern you.  If you develop fever, abdominal pain, vomiting, testicular pain or swelling, return to the ER for a recheck.

## 2013-08-06 NOTE — ED Notes (Signed)
Reports penile discharge and pain with urination.

## 2013-08-06 NOTE — ED Notes (Signed)
Pt states he began having symptoms 2 days ago.  Pt states he had "[place] and [place]".  Pt states he's having penile d/c and his urine feels warmer than usual.

## 2013-08-06 NOTE — ED Provider Notes (Signed)
CSN: [id number]     Arrival date & time 08/06/13  1014 History   None  This chart was scribed for Ryan Dredge PA-C, a non-physician practitioner working with Ryan Jakes, MD by Ryan Larsen, ED Scribe. This patient was seen in room TR05C/TR05C and the patient's care was started at 11:34 AM      Chief Complaint  Patient presents with  . SEXUALLY TRANSMITTED DISEASE     (Consider location/radiation/quality/duration/timing/severity/associated sxs/prior Treatment) The history is provided by the patient. No language interpreter was used.   HPI Comments: Ryan Larsen is a 28 y.o. male who presents to the Emergency Department complaining of possible sexually transmitted disease onset 2 days. States condom "busted during spring break recently." Reports associated dysuria, and penile dyscharge. Describes pain as burning. Reports pain is exacerbated with urination and alleviated when not urinating. Denies associated abdominal pain, fever, nausea, emesis, testicular pain, and testicular swelling. Denies PMHx of STD.   History reviewed. No pertinent past medical history. Past Surgical History  Procedure Laterality Date  . Hernia repair    . Laparoscopic appendectomy N/A 04/02/2013    Procedure: APPENDECTOMY LAPAROSCOPIC;  Surgeon: Dalia Heading, MD;  Location: [facility];  Service: General;  Laterality: N/A;   No family history on file. History  Substance Use Topics  . Smoking status: Current Every Day Smoker -- 0.50 packs/day for 3 years    Types: Cigarettes  . Smokeless tobacco: Not on file  . Alcohol Use: Yes     Comment: occassionally     Review of Systems  Constitutional: Negative for fever.  Gastrointestinal: Negative for nausea, vomiting and abdominal pain.  Genitourinary: Positive for dysuria and discharge. Negative for penile swelling, scrotal swelling, penile pain and testicular pain.  Skin: Negative for rash.  Psychiatric/Behavioral: Negative for confusion.   All other systems reviewed and are negative.      Allergies  Review of patient's allergies indicates no known allergies.  Home Medications   Current Outpatient Rx  Name  Route  Sig  Dispense  Refill  . Omega-3 Fatty Acids (FISH OIL PO)   Oral   Take 1 tablet by mouth daily.          BP 156/93  Pulse 80  Temp(Src) 98.1 F (36.7 C) (Oral)  Resp 18  SpO2 96% Physical Exam  Nursing note and vitals reviewed. Constitutional: He is oriented to person, place, and time. He appears well-developed and well-nourished. No distress.  HENT:  Head: Normocephalic and atraumatic.  Eyes: EOM are normal.  Neck: Neck supple. No tracheal deviation present.  Cardiovascular: Normal rate.   Pulmonary/Chest: Effort normal. No respiratory distress.  Abdominal: Soft. There is no tenderness.  Genitourinary: Testes normal. Right testis shows no swelling and no tenderness. Left testis shows no swelling and no tenderness. Circumcised. No penile tenderness. Discharge found.  No rash noted.  Chaperone was present during exam.   Musculoskeletal: Normal range of motion.  Lymphadenopathy:       Right: Inguinal adenopathy present.       Left: No inguinal adenopathy present.  Neurological: He is alert and oriented to person, place, and time.  Skin: Skin is warm and dry. No rash noted.  Psychiatric: He has a normal mood and affect. His behavior is normal.    ED Course  Procedures (including critical care time) COORDINATION OF CARE:  Nursing notes reviewed. Vital signs reviewed. Initial pt interview and examination performed.   11:34 AM-Discussed work up plan with pt at  bedside, which includes  Orders Placed This Encounter  Procedures  . GC/Chlamydia Probe Amp    Standing Status: Standing     Number of Occurrences: 1     Standing Expiration Date:     Order Specific Question:  Patient immune status    Answer:  Normal  . RPR    Standing Status: Standing     Number of Occurrences: 1      Standing Expiration Date:   . HIV antibody    Standing Status: Standing     Number of Occurrences: 1     Standing Expiration Date:   . Pt agrees with plan.   Treatment plan initiated: Medications  cefTRIAXone (ROCEPHIN) injection 250 mg (250 mg Intramuscular Given 08/06/13 1224)  azithromycin (ZITHROMAX) tablet 1,000 mg (1,000 mg Oral Given 08/06/13 1224)     Initial diagnostic testing ordered.     Labs Review Labs Reviewed - No data to display Imaging Review No results found.   EKG Interpretation None      MDM   Final diagnoses:  Penile discharge    Afebrile, nontoxic pt with penile discharge and dysuria after having condom break during sexual encounter recently.  He does not know who the partner was to be able to inform the person of the symptoms.  HIV, RPR, GC/Chlam pending.  Treated for GC/Chlam in ED.  [facility]  Discussed pending results, treatment, and follow up  with patient.  Pt given return precautions.  Pt verbalizes understanding and agrees with plan.      I personally performed the services described in this documentation, which was scribed in my presence. The recorded information has been reviewed and is accurate.    Ryan Dredgemily Jasiel Belisle, PA-C 08/06/13 1620

## 2013-08-06 NOTE — ED Notes (Signed)
Called for triage. No answer. 

## 2013-08-07 LAB — GC/CHLAMYDIA PROBE AMP
CT PROBE, AMP APTIMA: NEGATIVE
GC Probe RNA: POSITIVE — AB

## 2013-08-08 ENCOUNTER — Telehealth (HOSPITAL_BASED_OUTPATIENT_CLINIC_OR_DEPARTMENT_OTHER): Payer: Self-pay | Admitting: *Deleted

## 2013-08-08 NOTE — ED Provider Notes (Signed)
Medical screening examination/treatment/procedure(s) were performed by non-physician practitioner and as supervising physician I was immediately available for consultation/collaboration.   EKG Interpretation None        Shelda JakesScott W. Jeramy Dimmick, MD 08/08/13 1332

## 2013-08-08 NOTE — ED Notes (Signed)
+   gonorrhea Patient treated with Zithromax and Gonorrhea [facility]

## 2014-11-18 ENCOUNTER — Emergency Department (HOSPITAL_COMMUNITY)
Admission: EM | Admit: 2014-11-18 | Discharge: 2014-11-18 | Disposition: A | Discharge status: Home / Self Care | Payer: Self-pay | Attending: Emergency Medicine | Admitting: Emergency Medicine

## 2014-11-18 ENCOUNTER — Encounter (HOSPITAL_COMMUNITY): Payer: Self-pay | Admitting: Emergency Medicine

## 2014-11-18 DIAGNOSIS — Z720 Tobacco use: Secondary | ICD-10-CM | POA: Insufficient documentation

## 2014-11-18 DIAGNOSIS — A6920 Lyme disease, unspecified: Secondary | ICD-10-CM | POA: Insufficient documentation

## 2014-11-18 DIAGNOSIS — L02416 Cutaneous abscess of left lower limb: Secondary | ICD-10-CM | POA: Insufficient documentation

## 2014-11-18 DIAGNOSIS — Z79899 Other long term (current) drug therapy: Secondary | ICD-10-CM | POA: Insufficient documentation

## 2014-11-18 DIAGNOSIS — Z87828 Personal history of other (healed) physical injury and trauma: Secondary | ICD-10-CM | POA: Insufficient documentation

## 2014-11-18 DIAGNOSIS — L02213 Cutaneous abscess of chest wall: Secondary | ICD-10-CM | POA: Insufficient documentation

## 2014-11-18 DIAGNOSIS — M65051 Abscess of tendon sheath, right thigh: Secondary | ICD-10-CM | POA: Insufficient documentation

## 2014-11-18 DIAGNOSIS — L0291 Cutaneous abscess, unspecified: Secondary | ICD-10-CM

## 2014-11-18 LAB — BASIC METABOLIC PANEL
Anion gap: 6 (ref 5–15)
BUN: 10 mg/dL (ref 6–20)
CHLORIDE: 103 mmol/L (ref 101–111)
CO2: 26 mmol/L (ref 22–32)
CREATININE: 1.01 mg/dL (ref 0.61–1.24)
Calcium: 9.9 mg/dL (ref 8.9–10.3)
GFR calc non Af Amer: >60 mL/min (ref >60)
Glucose, Bld: 95 mg/dL (ref 65–99)
Potassium: 3.8 mmol/L (ref 3.5–5.1)
Sodium: 135 mmol/L (ref 135–145)

## 2014-11-18 LAB — CBC
HCT: 42.1 % (ref 39.0–52.0)
HEMOGLOBIN: 14.2 g/dL (ref 13.0–17.0)
MCH: 29.5 pg (ref 26.0–34.0)
MCHC: 33.7 g/dL (ref 30.0–36.0)
MCV: 87.3 fL (ref 78.0–100.0)
Platelets: 194 10*3/uL (ref 150–400)
RBC: 4.82 MIL/uL (ref 4.22–5.81)
RDW: 13.2 % (ref 11.5–15.5)
WBC: 10.6 10*3/uL — ABNORMAL HIGH (ref 4.0–10.5)

## 2014-11-18 MED ORDER — DOXYCYCLINE HYCLATE 100 MG PO CAPS: 100.0000 mg | ORAL_CAPSULE | Freq: Two times a day (BID) | ORAL | Status: DC

## 2014-11-18 MED ORDER — DOXYCYCLINE HYCLATE 100 MG PO CAPS: 100.0000 mg | ORAL_CAPSULE | Freq: Two times a day (BID) | ORAL | Status: AC

## 2014-11-18 MED ORDER — LIDOCAINE-EPINEPHRINE 1 %-1:100000 IJ SOLN
10.0000 mL | Freq: Once | INTRAMUSCULAR | Status: AC
  Administered 2014-11-18: 10 mL
  Filled 2014-11-18: qty 1

## 2014-11-18 MED ORDER — DOXYCYCLINE HYCLATE 100 MG PO TABS
100.0000 mg | ORAL_TABLET | Freq: Once | ORAL | Status: AC
  Administered 2014-11-18: 100 mg via ORAL
  Filled 2014-11-18: qty 1

## 2014-11-18 NOTE — ED Provider Notes (Signed)
CSN: [id number]     Arrival date & time 11/18/14  1604 History   This chart was scribed for non-physician practitioner, Dierdre Forth PA-C working with Bethann Berkshire, MD by Arlan Organ, ED Scribe. This patient was seen in room TR11C/TR11C and the patient's care was started at 5:44 PM.   Chief Complaint  Patient presents with  . Insect Bite   The history is provided by the patient and medical records. No language interpreter was used.    HPI Comments: Ryan Larsen is a 29 y.o. male without any pertinent past medical history who presents to the Emergency Department here after insect bites to the L posterior knee and L side of the chest sustained 2 weeks ago. Pt states he was initially bitten by a tick 2 weeks ago and bitten a second time 3-4 days ago. Both ticks were removed successfully and completely. However, pt states he has noted swelling and "knots" to the L ankle and L chest.  He reports a low grade fever of ~99.0 which has now resolved. No at home treatments attempted prior to arrival. He denies any chills or suspicious rash. No previous history of skin infections. No known allergies to medications.  History reviewed. No pertinent past medical history. Past Surgical History  Procedure Laterality Date  . Hernia repair    . Laparoscopic appendectomy N/A 04/02/2013    Procedure: APPENDECTOMY LAPAROSCOPIC;  Surgeon: Dalia Heading, MD;  Location: [facility];  Service: General;  Laterality: N/A;  . Appendectomy     No family history on file. History  Substance Use Topics  . Smoking status: Current Every Day Smoker -- 0.50 packs/day for 3 years    Types: Cigarettes  . Smokeless tobacco: Not on file  . Alcohol Use: Yes     Comment: occassionally     Review of Systems  Constitutional: Positive for fever (low grade; subjective). Negative for chills, diaphoresis, appetite change, fatigue and unexpected weight change.  HENT: Negative for mouth sores.   Eyes: Negative for  visual disturbance.  Respiratory: Negative for cough, chest tightness, shortness of breath and wheezing.   Cardiovascular: Negative for chest pain.  Gastrointestinal: Negative for nausea, vomiting, abdominal pain, diarrhea and constipation.  Endocrine: Negative for polydipsia, polyphagia and polyuria.  Genitourinary: Negative for dysuria, urgency, frequency and hematuria.  Musculoskeletal: Negative for back pain and neck stiffness.  Skin: Positive for color change, rash and wound.  Allergic/Immunologic: Negative for immunocompromised state.  Neurological: Negative for syncope, light-headedness and headaches.  Hematological: Does not bruise/bleed easily.  Psychiatric/Behavioral: Negative for confusion and sleep disturbance. The patient is not nervous/anxious.       Allergies  Review of patient's allergies indicates no known allergies.  Home Medications   Prior to Admission medications   Medication Sig Start Date End Date Taking? Authorizing Provider  doxycycline (VIBRAMYCIN) 100 MG capsule Take 1 capsule (100 mg total) by mouth 2 (two) times daily. 11/18/14   Camrin Gearheart, PA-C  Omega-3 Fatty Acids (FISH OIL PO) Take 1 tablet by mouth daily.    Historical Provider, MD   Triage Vitals: BP 147/89 mmHg  Pulse 92  Temp(Src) 98.4 F (36.9 C) (Oral)  Resp 18  Ht 6' (1.829 m)  Wt 187 lb (84.823 kg)  BMI 25.36 kg/m2  SpO2 98%   Physical Exam  Constitutional: He is oriented to person, place, and time. He appears well-developed and well-nourished. No distress.  Awake, alert, nontoxic appearance  HENT:  Head: Normocephalic and atraumatic.  Mouth/Throat:  Oropharynx is clear and moist. No oropharyngeal exudate.  Eyes: Conjunctivae are normal. No scleral icterus.  Neck: Normal range of motion. Neck supple.  Cardiovascular: Normal rate, regular rhythm, normal heart sounds and intact distal pulses.   Pulmonary/Chest: Effort normal and breath sounds normal. No respiratory distress.  He has no wheezes.  Equal chest expansion  Abdominal: Soft. Bowel sounds are normal. He exhibits no distension and no mass. There is no tenderness. There is no rebound and no guarding.  Musculoskeletal: Normal range of motion. He exhibits no edema.  No joint swelling, erythema, or increased warmth   Lymphadenopathy:    He has no cervical adenopathy.  Neurological: He is alert and oriented to person, place, and time.  Speech is clear and goal oriented Moves extremities without ataxia  Skin: Skin is warm and dry. He is not diaphoretic. There is erythema.  Multiple circumferential areas of erythema with central clearing and central ulceration located to L lower leg, R thigh, and L chest Lesion of L chest is erythematous and indurated with central fluctuance   Psychiatric: He has a normal mood and affect.  Nursing note and vitals reviewed.   ED Course  Procedures (including critical care time)  DIAGNOSTIC STUDIES: Oxygen Saturation is 98% on RA, Normal by my interpretation.    COORDINATION OF CARE: 5:51 PM- Will perform I&D procedure. Discussed treatment plan with pt at bedside and pt agreed to plan.     INCISION AND DRAINAGE PROCEDURE NOTE: Patient identification was confirmed and verbal consent was obtained. This procedure was performed by Junious Silk PA-C at 6:59 PM. Site: L chest Sterile procedures observed Anesthetic used (type and amt): 3 CC of 1% lidocaine with epinephrine  Drainage: Large amount of pus Blade: 11 Complexity: Complex Packing used: None Site anesthetized, incision made over site, wound drained and explored loculations, rinsed with copious amounts of normal saline, wound packed with sterile gauze, covered with dry, sterile dressing.  Pt tolerated procedure well without complications.  Instructions for care discussed verbally and pt provided with additional written instructions for homecare and f/u.   7:08 PM- Will send home with a prescription for  Doxycycline.  Labs Review Labs Reviewed  ROCKY MTN SPOTTED FVR ABS PNL(IGG+IGM)  B. BURGDORFI ANTIBODIES  LYME DISEASE DNA BY PCR(BORRELIA BURG)  CBC  BASIC METABOLIC PANEL  EHRLICHIA ANTIBODY PANEL    Imaging Review No results found.   EKG Interpretation None       EMERGENCY DEPARTMENT US SOFT TISSUE INTERPRETATION "Study: Limited Ultrasound of the noted body part in comments below"  INDICATIONS: Soft tissue infection Multiple views of the body part are obtained with a multi-frequency linear probe  PERFORMED BY:  Myself  IMAGES ARCHIVED?: Yes  SIDE:Left  BODY PART: chest wall  FINDINGS: Abcess present and Cellulitis present  LIMITATIONS:  Body Habitus  INTERPRETATION:  Abcess present and Cellulitis present  COMMENT:  Small abscess with surrounding  Cellulitis; does not track deeply into the tissue    MDM   Final diagnoses:  Erythema migrans (Lyme disease)  Abscess   Callum C Tankard presents with lesions to the left lower leg, right thigh and left chest. Rash is consistent with erythema migrans and patient with recent tick bite. Significant concern for early but disseminated Lyme disease. Ehrlichia, [facility] spotted fever and Lyme titers pending.  Pt will be treated with doxycycline.  I&D of skin abscess on the left upper chest wall.  [place] confirms no deep tracking abscess.  Pt without signs  of sepsis.  Basic labs pending.  8:21 PM No leukopenia, thrombocytopenia or anemia noted on labs. Patient is well-appearing. Titers and cultures pending. Patient will be discharged home with doxycycline. He is afebrile with no neurologic deficits.  BP 147/89 mmHg  Pulse 92  Temp(Src) 98.4 F (36.9 C) (Oral)  Resp 18  Ht 6' (1.829 m)  Wt 187 lb (84.823 kg)  BMI 25.36 kg/m2  SpO2 98%  I personally performed the services described in this documentation, which was scribed in my presence. The recorded information has been reviewed and is accurate.   Dahlia ClientHannah  Tracy Gerken, PA-C 11/18/14 [id number]  Bethann BerkshireJoseph Zammit, MD 11/18/14 2220

## 2014-11-18 NOTE — ED Notes (Signed)
Pt reports tick bite to posterior L knee 2 weeks ago.  States a "knot" came up and then went away.  Similar "knot/sore" to L ankle and L chest x 3-4 days.

## 2014-11-18 NOTE — Discharge Instructions (Signed)
1. Medications: Doxycycline, usual home medications 2. Treatment: rest, drink plenty of fluids, her antibiotics until it is completed 3. Follow Up: Please followup with own wellness center and 3-5 days for discussion of your diagnoses and further evaluation after today's visit; if you do not have a primary care doctor use the resource guide provided to find one; Please return to the ER for worsening symptoms, high fevers, altered mental status or development of new abscesses.  Lyme Disease You may have been bitten by a tick and are to watch for the development of Lyme Disease. Lyme Disease is an infection that is caused by a bacteria The bacteria causing this disease is named Borreilia burgdorferi. If a tick is infected with this bacteria and then bites you, then Lyme Disease may occur. These ticks are carried by deer and rodents such as rabbits and mice and infest grassy as well as forested areas. Fortunately most tick bites do not cause Lyme Disease.  Lyme Disease is easier to prevent than to treat. First, covering your legs with clothing when walking in areas where ticks are possibly abundant will prevent their attachment because ticks tend to stay within inches of the ground. Second, using insecticides containing DEET can be applied on skin or clothing. Last, because it takes about 12 to 24 hours for the tick to transmit the disease after attachment to the human host, you should inspect your body for ticks twice a day when you are in areas where Lyme Disease is common. You must look thoroughly when searching for ticks. The Ixodes tick that carries Lyme Disease is very small. It is around the size of a sesame seed (picture of tick is not actual size). Removal is best done by grasping the tick by the head and pulling it out. Do not to squeeze the body of the tick. This could inject the infecting bacteria into the bite site. Wash the area of the bite with an antiseptic solution after removal.  Lyme Disease  is a disease that may affect many body systems. Because of the small size of the biting tick, most people do not notice being bitten. The first sign of an infection is usually a round red rash that extends out from the center of the tick bite. The center of the lesion may be blood colored (hemorrhagic) or have tiny blisters (vesicular). Most lesions have bright red outer borders and partial central clearing. This rash may extend out many inches in diameter, and multiple lesions may be present. Other symptoms such as fatigue, headaches, chills and fever, general achiness and swelling of lymph glands may also occur. If this first stage of the disease is left untreated, these symptoms may gradually resolve by themselves, or progressive symptoms may occur because of spread of infection to other areas of the body.  Follow up with your caregiver to have testing and treatment if you have a tick bite and you develop any of the above complaints. Your caregiver may recommend preventative (prophylactic) medications which kill bacteria (antibiotics). Once a diagnosis of Lyme Disease is made, antibiotic treatment is highly likely to cure the disease. Effective treatment of late stage Lyme Disease may require longer courses of antibiotic therapy.  MAKE SURE YOU:   Understand these instructions.  Will watch your condition.  Will get help right away if you are not doing well or get worse. Document Released: 07/31/2000 Document Revised: 07/17/2011 Document Reviewed: 10/02/2008 [facility] Patient Information 2015 [place], [place]. This information is not intended to replace  advice given to you by your health care provider. Make sure you discuss any questions you have with your health care provider.   Emergency Department Resource Guide 1) Find a Doctor and Pay Out of Pocket Although you won't have to find out who is covered by your insurance plan, it is a good idea to ask around and get recommendations. You will then need  to call the office and see if the doctor you have chosen will accept you as a new patient and what types of options they offer for patients who are self-pay. Some doctors offer discounts or will set up payment plans for their patients who do not have insurance, but you will need to ask so you aren't surprised when you get to your appointment.  2) Contact Your Local Health Department Not all health departments have doctors that can see patients for sick visits, but many do, so it is worth a call to see if yours does. If you don't know where your local health department is, you can check in your phone book. The CDC also has a tool to help you locate your state's health department, and many state websites also have listings of all of their local health departments.  3) Find a Walk-in Clinic If your illness is not likely to be very severe or complicated, you may want to try a walk in clinic. These are popping up all over the country in pharmacies, drugstores, and shopping centers. They're usually staffed by nurse practitioners or physician assistants that have been trained to treat common illnesses and complaints. They're usually fairly quick and inexpensive. However, if you have serious medical issues or chronic medical problems, these are probably not your best option.  No Primary Care Doctor: - Call Health Connect at  [phone] - they can help you locate a primary care doctor that  accepts your insurance, provides certain services, etc. - Physician Referral Service- [phone]  Chronic Pain Problems: Organization         Address  Phone   Notes  [facility]  [phone] Patients need to be referred by their primary care doctor.   Medication Assistance: Organization         Address  Phone   Notes  [facility] [address] [place], [place] [address] [phone] --Must be a resident of [facility] -- Must have NO insurance  coverage whatsoever (no Medicaid/ Medicare, etc.) -- The pt. MUST have a primary care doctor that directs their care regularly and follows them in the community   [facility]ist  [phone]   [employer]  [phone]    Agencies that provide inexpensive medical care: Organization         Address  Phone   Notes  [facility]  [phone]   [facility]    [phone]   [facility] [address] [place], [place] [address] [phone]   [facility] [address], [place] [phone]   Planned Parenthood    [phone]   [facility]    [phone]   [facility]  [address], [place] Phone:  [phone], Fax:  [phone] Hours of Operation:  9 am - 6 pm, M-F.  Also accepts Medicaid/Medicare and self-pay.  [facility]  [address], [place] Phone: [phone])  [phone], Fax: [phone]. Hours of Operation:  8:30 am - 5:30 pm, M-F.  Also accepts Medicaid and self-pay.  [facility] [place] [address], [place] Phone: [phone]   [facility] [address] Natasha Bence [place], [place] [phone], Ext. [phone] Mondays & Thursdays: 7-9 AM.  First 15 patients are seen on a first come, first serve basis.    Medicaid-accepting [facility] Providers:  Organization         Address  Phone   Notes  [facility] [address], [place] [phone] Also accepts self-pay patients.  [facility] [address] Laurell Josephs [place], [place]  [phone]   [facility] [address], [place] [phone]   [facility] [address] [place] [phone]   Renaye Rakers [address], [place]   [phone] Only accepts [place] patients after they have their  name applied to their card.   Self-Pay (no insurance) in [facility]:  Organization         Address  Phone   Notes  Sickle Cell Patients, [facility] [address] [place], [place] [phone]   [facility] [address] [place], [place] [phone]   [facility]  [address], [place] [phone]   [facility]/Dr. Osei-Bonsu  [address] [place] or [address], [place] [phone] Phone number for both [place] and [place] locations is the same.  [facility] [address] [place] [phone]   [facility] [address] [place] or [address] [phone]   [place] [address], [place] [phone], phone; [phone], fax Sees patients 1st and 3rd Saturday of every month.  Must not qualify for public or private insurance (i.e. Medicaid, Medicare, [place] Health Choice, Veterans' Benefits)  Household income should be no more than 200% of the poverty level The clinic cannot treat you if you are pregnant or think you are pregnant  Sexually transmitted diseases are not treated at the clinic.    Dental Care: Organization         Address  Phone  Notes  [facility] [address] [place], [place] [phone] Accepts children up to age 27 who are enrolled in [place] or [place] Health Choice; pregnant women with a Medicaid card; and children who have applied for Medicaid or [place] Health Choice, but were declined, whose parents can pay a reduced fee at time of service.  [facility]  [address], [place] [phone] Accepts children up to age 18 who are enrolled in [place] or [place] Health Choice; pregnant women with a Medicaid card; and children who have applied for  Medicaid or [place] Health Choice, but were declined, whose parents can pay a reduced fee at time of service.  [facility]  [address] [place], [place] [phone] Patients are seen by appointment only. Walk-ins are not accepted. [facility] will see patients 10 years of age and older. Monday - Tuesday (8am-5pm) Most Wednesdays (8:30-5pm) $30 per visit, cash only  [facility]  [address], [employer]  [place] [phone] Patients are seen by appointment only. Walk-ins are not accepted. [facility] will see patients 19 years of age and older. One Wednesday Evening (Monthly: Volunteer Based).  $30 per visit, cash only  [employer]  [phone] for adults; Children under age 11, call Graduate Pediatric Dentistry at [phone]. Children aged 34-14, please call [phone] to request a pediatric application.  Dental services are provided in all areas of dental care including fillings, crowns and bridges, complete and partial dentures, implants, gum treatment, root canals, and extractions. Preventive care is also provided. Treatment is provided to both adults and children. Patients are selected via a lottery and there is often a waiting list.   [facility] [address], [place]  [phone] [email or web address]   [facility] [address] [place], [place] [phone], Ext. [phone] Second and Fourth Thursday of each month, opens at 6:30 AM; Clinic ends at 9 AM.  Patients are seen on a first-come first-served basis, and a limited number are seen during each clinic.   [facility]  [address] Ether Griffins [place], [place] [phone]   Eligibility Requirements You must have lived in [place], [place], or [place] counties for at least the last three months.   You cannot be eligible for state or federal sponsored [place], including [employer], [place],  or [employer].   You generally cannot be eligible for healthcare insurance through your employer.    How to apply: Eligibility screenings are held every Tuesday and Wednesday afternoon from 1:00 pm until 4:00 pm. You do not need an appointment for the interview!  [facility] [address], [place], [place] [phone]   [facility] Health Department  [phone]   [facility] Health Department  [phone]   [facility] Health Department  [phone]    Behavioral Health Resources in the Community: Intensive Outpatient Programs Organization         Address  Phone  Notes  [facility] Services [address], [place], [place] [phone]   [facility] [address], [place], [place] [phone]   [facility] [address] [place], [place]  [phone]   [facility] [address]  [place], [place] [phone] or [phone]   Substance Abuse Resources Organization         Address  Phone  Notes  [facility]  [phone]   [facility]  [phone]   [facility]  [phone]   Floydene Flock  [phone]   Residential & Outpatient Substance Abuse Program  [phone]   Psychological Services Organization         Address  Phone  Notes  [facility]  [phone]   [facility]  [phone]   [facility] [address] [place] [phone] or [phone]    Mobile Crisis Teams Organization         Address  Phone  Notes  [facility], Mobile Crisis Care Unit  [phone]   [facility]  [address] [place], [place] [phone]   Doristine Locks [address] [place] [phone]    Self-Help/Support Groups Organization         Address  Phone             Notes  [facility] - variety of support groups  336- [phone] Call for more information  Narcotics Anonymous (NA), [facility] [address], [place]  2 meetings at this location   [facility]idential [facility] Programs Organization         Address  Phone  Notes  [facility] [address],    [place]  [phone]   [facility]  [address] [place] [phone], [place], [place] [phone]   [facility] [address], [place] [phone] Admissions: 8am-3pm M-F  [facility] [address]    [place], [place] [phone]   [facility] [address], [place], [place] [phone]   [facility] [address]  [place], [place] [phone]   [facility] - Intensive Outpatient [address], [place], [place] [phone]   [facility] [address]  [place], [place] [phone] or [phone]   [facility] ([facility]) [address] [place], [place] [phone] Accepts Medicaid  [facility] [address]  [place] [phone] Substance Abuse/Addiction Treatment   [facility] Resources Organization         Address  Phone  Notes  [facility]  [phone](888) [phone]   Angie FavaJulie Brannon, PhD [address], [address] [place], [place]   [phone] or [phone]) [phone]   [facility]   [address] [place], [place] [phone]) [phone]   [facility] [address], [place], [place] [phone]) [phone] Insurance/Medicaid/sponsorship through [facility]  [facility] [address]                                    [place], [place] [phone]) [phone] Therapy/tele-psych/case  [facility] [address]   [place], [place] [phone]    Dr. Lolly MustacheArfeen  [phone]) [phone]   [facility]  [employer] [facility] 1) [address], [place] 2) [address], [place] 3)  [address], [place] [phone]  [phone]   [facility] Child Abuse Hotline [phone](336) [phone] or [phone]) 7872051320 (After Hours)

## 2014-11-19 LAB — B. BURGDORFI ANTIBODIES: B burgdorferi Ab IgG+IgM: <0.91 {ISR} (ref 0.00–0.90)

## 2014-11-20 LAB — ROCKY MTN SPOTTED FVR ABS PNL(IGG+IGM)
RMSF IgG: POSITIVE — AB
RMSF IgM: 0.53 index (ref 0.00–0.89)

## 2014-11-20 LAB — RMSF, IGG, IFA: RMSF, IGG, IFA: <1:64 {titer}

## 2014-11-23 LAB — MISCELLANEOUS TEST: Miscellaneous Test: 15868

## 2014-11-25 LAB — EHRLICHIA ANTIBODY PANEL
E chaffeensis (HGE) Ab, IgG: NEGATIVE
E chaffeensis (HGE) Ab, IgM: NEGATIVE
E. Chaffeensis (HME) IgM Titer: NEGATIVE
E.Chaffeensis (HME) IgG: NEGATIVE

## 2015-12-30 ENCOUNTER — Encounter (HOSPITAL_COMMUNITY): Payer: Self-pay | Admitting: *Deleted

## 2015-12-30 ENCOUNTER — Emergency Department (HOSPITAL_COMMUNITY)
Admission: EM | Admit: 2015-12-30 | Discharge: 2015-12-30 | Disposition: A | Discharge status: Home / Self Care | Payer: Self-pay | Attending: Emergency Medicine | Admitting: Emergency Medicine

## 2015-12-30 DIAGNOSIS — Z5321 Procedure and treatment not carried out due to patient leaving prior to being seen by health care provider: Secondary | ICD-10-CM | POA: Insufficient documentation

## 2015-12-30 LAB — ETHANOL: Alcohol, Ethyl (B): 106 mg/dL — ABNORMAL HIGH (ref <5)

## 2015-12-30 NOTE — ED Notes (Addendum)
Pt did not respond to call. 

## 2015-12-30 NOTE — ED Notes (Signed)
When rounding on pt. Pt had left the room, bathrooms were checked pt not located. PA at bedside.  Per security pt had been seen leaving the building. PA will be notified.

## 2015-12-30 NOTE — ED Triage Notes (Signed)
Pt states that he was advised that he could request his own ETOH blood work; pt states that he was pulled over for a DUI [facility]

## 2019-02-12 ENCOUNTER — Other Ambulatory Visit: Payer: Self-pay

## 2019-02-12 ENCOUNTER — Emergency Department (HOSPITAL_COMMUNITY)
Admission: EM | Admit: 2019-02-12 | Discharge: 2019-02-13 | Disposition: A | Discharge status: Home / Self Care | Payer: Self-pay | Attending: Emergency Medicine | Admitting: Emergency Medicine

## 2019-02-12 ENCOUNTER — Encounter (HOSPITAL_COMMUNITY): Payer: Self-pay | Admitting: Emergency Medicine

## 2019-02-12 DIAGNOSIS — F17210 Nicotine dependence, cigarettes, uncomplicated: Secondary | ICD-10-CM | POA: Insufficient documentation

## 2019-02-12 DIAGNOSIS — Z79899 Other long term (current) drug therapy: Secondary | ICD-10-CM | POA: Insufficient documentation

## 2019-02-12 DIAGNOSIS — Z202 Contact with and (suspected) exposure to infections with a predominantly sexual mode of transmission: Secondary | ICD-10-CM | POA: Insufficient documentation

## 2019-02-12 DIAGNOSIS — Z711 Person with feared health complaint in whom no diagnosis is made: Secondary | ICD-10-CM

## 2019-02-12 NOTE — ED Triage Notes (Signed)
States a sexual partner tested positive for STD.  Pt requesting STD testing.  Denies any symptoms.

## 2019-02-13 LAB — URINALYSIS, ROUTINE W REFLEX MICROSCOPIC
Bilirubin Urine: NEGATIVE
Glucose, UA: NEGATIVE mg/dL
Hgb urine dipstick: NEGATIVE
Ketones, ur: NEGATIVE mg/dL
Leukocytes,Ua: NEGATIVE
Nitrite: NEGATIVE
Protein, ur: NEGATIVE mg/dL
Specific Gravity, Urine: 1.008 (ref 1.005–1.030)
pH: 5 (ref 5.0–8.0)

## 2019-02-13 MED ORDER — AZITHROMYCIN 250 MG PO TABS
1000.0000 mg | ORAL_TABLET | Freq: Once | ORAL | Status: AC
Start: 2019-02-13 — End: 2019-02-13
  Administered 2019-02-13: 1000 mg via ORAL
  Filled 2019-02-13: qty 4

## 2019-02-13 MED ORDER — LIDOCAINE HCL (PF) 1 % IJ SOLN
5.0000 mL | Freq: Once | INTRAMUSCULAR | Status: AC
  Administered 2019-02-13: 01:00:00 5 mL
  Filled 2019-02-13: qty 5

## 2019-02-13 MED ORDER — CEFTRIAXONE SODIUM 250 MG IJ SOLR
250.0000 mg | Freq: Once | INTRAMUSCULAR | Status: AC
Start: 2019-02-13 — End: 2019-02-13
  Administered 2019-02-13: 250 mg via INTRAMUSCULAR
  Filled 2019-02-13: qty 250

## 2019-02-13 NOTE — ED Provider Notes (Signed)
[facility] EMERGENCY DEPARTMENT Provider Note   CSN: [id number] Arrival date & time: 02/12/19  2256     History   Chief Complaint Chief Complaint  Patient presents with  . Exposure to STD    HPI Ryan Larsen is a 33 y.o. male.     33 year old male presents to the emergency department expressing concern for STDs.  States that 1 of his sexual partners recently tested positive for gonorrhea.  He is here for testing and treatment.  States that he is asymptomatic.  Specifically denies abdominal pain, nausea, vomiting, fevers, penile discharge, genital sores or lesions, testicular tenderness, scrotal swelling.  He has been sexually active with a total of 2 partners in the past 6 months, most recently with the partner who tested STD+  The history is provided by the patient. No language interpreter was used.  Exposure to STD    History reviewed. No pertinent past medical history.  Patient Active Problem List   Diagnosis Date Noted  . Acute appendicitis 04/02/2013    Past Surgical History:  Procedure Laterality Date  . APPENDECTOMY    . HERNIA REPAIR    . LAPAROSCOPIC APPENDECTOMY N/A 04/02/2013   Procedure: APPENDECTOMY LAPAROSCOPIC;  Surgeon: Jamesetta So, MD;  Location: [facility];  Service: General;  Laterality: N/A;        Home Medications    Prior to Admission medications   Medication Sig Start Date End Date Taking? Authorizing Provider  doxycycline (VIBRAMYCIN) 100 MG capsule Take 1 capsule (100 mg total) by mouth 2 (two) times daily. 11/18/14   Muthersbaugh, Jarrett Soho, PA-C  Omega-3 Fatty Acids (FISH OIL PO) Take 1 tablet by mouth daily.    [provider]    Family History No family history on file.  Social History Social History   Tobacco Use  . Smoking status: Current Every Day Smoker    Packs/day: 1.00    Years: 3.00    Pack years: 3.00    Types: Cigarettes  . Smokeless tobacco: Never Used  Substance Use Topics  .  Alcohol use: Yes    Comment: occassionally   . Drug use: No     Allergies   Patient has no known allergies.   Review of Systems Review of Systems Ten systems reviewed and are negative for acute change, except as noted in the HPI.    Physical Exam Updated Vital Signs BP (!) 151/108   Pulse 93   Temp 98.5 F (36.9 C) (Oral)   Resp 12   SpO2 96%   Physical Exam Vitals signs and nursing note reviewed.  Constitutional:      General: He is not in acute distress.    Appearance: He is well-developed. He is not diaphoretic.     Comments: Nontoxic appearing and in NAD  HENT:     Head: Normocephalic and atraumatic.  Eyes:     General: No scleral icterus.    Conjunctiva/sclera: Conjunctivae normal.  Neck:     Musculoskeletal: Normal range of motion.  Pulmonary:     Effort: Pulmonary effort is normal. No respiratory distress.     Comments: Respirations even and unlabored Musculoskeletal: Normal range of motion.  Skin:    General: Skin is warm and dry.     Coloration: Skin is not pale.     Findings: No erythema or rash.  Neurological:     Mental Status: He is alert and oriented to person, place, and time.     Comments: Ambulatory  with steady gait.  Psychiatric:        Behavior: Behavior normal.      ED Treatments / Results  Labs (all labs ordered are listed, but only abnormal results are displayed) Labs Reviewed  URINALYSIS, ROUTINE W REFLEX MICROSCOPIC - Abnormal; Notable for the following components:      Result Value   Color, Urine STRAW (*)    All other components within normal limits  GC/CHLAMYDIA PROBE AMP ([place]) NOT AT [facility]    EKG None  Radiology No results found.  Procedures Procedures (including critical care time)  Medications Ordered in ED Medications  cefTRIAXone (ROCEPHIN) injection 250 mg (250 mg Intramuscular Given 02/13/19 0044)  azithromycin (ZITHROMAX) tablet 1,000 mg (1,000 mg Oral Given 02/13/19 0044)  lidocaine (PF) (XYLOCAINE)  1 % injection 5 mL (5 mLs Other Given 02/13/19 0044)     Initial Impression / Assessment and Plan / ED Course  I have reviewed the triage vital signs and the nursing notes.  Pertinent labs & imaging results that were available during my care of the patient were reviewed by me and considered in my medical decision making (see chart for details).        Patient to be discharged with instructions to follow up with the Health Department. Discussed importance of using protection when sexually active. Patient understands that they have GC/Chlamydia cultures pending and that they will need to inform all sexual partners if results return positive. Pt has been treated prophylacticly with azithromycin and rocephin due to pts history of STD exposure. Return precautions discussed and provided. Patient discharged in stable condition with no unaddressed concerns.   Final Clinical Impressions(s) / ED Diagnoses   Final diagnoses:  Concern about STD in male without diagnosis    ED Discharge Orders    None       Antony Madura, PA-C 02/13/19 0125    Ward, Layla Maw, DO 02/13/19 [phone]

## 2019-02-13 NOTE — Discharge Instructions (Addendum)
You have been treated for gonorrhea and chlamydia today. Follow-up with the health department in 48 hours for the results of your STD tests. If you test positive for STDs, notify all sexual partners of their need to be tested and treated as well. Do not engage in sexual intercourse for one week. Use a condom when sexually active. °

## 2019-02-14 LAB — GC/CHLAMYDIA PROBE AMP (~~LOC~~) NOT AT ARMC
Chlamydia: NEGATIVE
Neisseria Gonorrhea: NEGATIVE

## 2020-03-26 ENCOUNTER — Emergency Department (HOSPITAL_COMMUNITY)
Admission: EM | Admit: 2020-03-26 | Discharge: 2020-03-26 | Disposition: A | Discharge status: Home / Self Care | Payer: Self-pay | Attending: Emergency Medicine | Admitting: Emergency Medicine

## 2020-03-26 ENCOUNTER — Encounter (HOSPITAL_COMMUNITY): Payer: Self-pay | Admitting: Emergency Medicine

## 2020-03-26 ENCOUNTER — Emergency Department (HOSPITAL_COMMUNITY): Payer: Self-pay

## 2020-03-26 DIAGNOSIS — R0781 Pleurodynia: Secondary | ICD-10-CM | POA: Insufficient documentation

## 2020-03-26 DIAGNOSIS — R930 Abnormal findings on diagnostic imaging of skull and head, not elsewhere classified: Secondary | ICD-10-CM | POA: Insufficient documentation

## 2020-03-26 DIAGNOSIS — F17210 Nicotine dependence, cigarettes, uncomplicated: Secondary | ICD-10-CM | POA: Insufficient documentation

## 2020-03-26 DIAGNOSIS — Y92410 Unspecified street and highway as the place of occurrence of the external cause: Secondary | ICD-10-CM | POA: Insufficient documentation

## 2020-03-26 DIAGNOSIS — Y9389 Activity, other specified: Secondary | ICD-10-CM | POA: Insufficient documentation

## 2020-03-26 LAB — COMPREHENSIVE METABOLIC PANEL
ALT: 24 U/L (ref 0–44)
AST: 22 U/L (ref 15–41)
Albumin: 3.9 g/dL (ref 3.5–5.0)
Alkaline Phosphatase: 82 U/L (ref 38–126)
Anion gap: 10 (ref 5–15)
BUN: 9 mg/dL (ref 6–20)
CO2: 21 mmol/L — ABNORMAL LOW (ref 22–32)
Calcium: 8.8 mg/dL — ABNORMAL LOW (ref 8.9–10.3)
Chloride: 110 mmol/L (ref 98–111)
Creatinine, Ser: 0.88 mg/dL (ref 0.61–1.24)
GFR, Estimated: >60 mL/min (ref >60)
Glucose, Bld: 90 mg/dL (ref 70–99)
Potassium: 3.7 mmol/L (ref 3.5–5.1)
Sodium: 141 mmol/L (ref 135–145)
Total Bilirubin: 0.5 mg/dL (ref 0.3–1.2)
Total Protein: 7.1 g/dL (ref 6.5–8.1)

## 2020-03-26 LAB — CBC WITH DIFFERENTIAL/PLATELET
Abs Immature Granulocytes: 0.09 10*3/uL — ABNORMAL HIGH (ref 0.00–0.07)
Basophils Absolute: 0.1 10*3/uL (ref 0.0–0.1)
Basophils Relative: 1 %
Eosinophils Absolute: 0.2 10*3/uL (ref 0.0–0.5)
Eosinophils Relative: 1 %
HCT: 37.5 % — ABNORMAL LOW (ref 39.0–52.0)
Hemoglobin: 12.4 g/dL — ABNORMAL LOW (ref 13.0–17.0)
Immature Granulocytes: 1 %
Lymphocytes Relative: 26 %
Lymphs Abs: 3 10*3/uL (ref 0.7–4.0)
MCH: 31.3 pg (ref 26.0–34.0)
MCHC: 33.1 g/dL (ref 30.0–36.0)
MCV: 94.7 fL (ref 80.0–100.0)
Monocytes Absolute: 0.9 10*3/uL (ref 0.1–1.0)
Monocytes Relative: 8 %
Neutro Abs: 7.6 10*3/uL (ref 1.7–7.7)
Neutrophils Relative %: 63 %
Platelets: 264 10*3/uL (ref 150–400)
RBC: 3.96 MIL/uL — ABNORMAL LOW (ref 4.22–5.81)
RDW: 13.6 % (ref 11.5–15.5)
WBC: 11.9 10*3/uL — ABNORMAL HIGH (ref 4.0–10.5)
nRBC: 0 % (ref 0.0–0.2)

## 2020-03-26 LAB — PHOSPHORUS: Phosphorus: 3.2 mg/dL (ref 2.5–4.6)

## 2020-03-26 LAB — CBG MONITORING, ED: Glucose-Capillary: 89 mg/dL (ref 70–99)

## 2020-03-26 LAB — ETHANOL: Alcohol, Ethyl (B): 114 mg/dL — ABNORMAL HIGH (ref <10)

## 2020-03-26 LAB — MAGNESIUM: Magnesium: 2.1 mg/dL (ref 1.7–2.4)

## 2020-03-26 MED ORDER — MELOXICAM 15 MG PO TABS: 15.0000 mg | ORAL_TABLET | Freq: Every day | ORAL | 0 refills | Status: AC

## 2020-03-26 MED ORDER — CYCLOBENZAPRINE HCL 10 MG PO TABS
5.0000 mg | ORAL_TABLET | Freq: Two times a day (BID) | ORAL | 0 refills | Status: AC | PRN
Start: 2020-03-26 — End: ?

## 2020-03-26 MED ORDER — ACETAMINOPHEN 325 MG PO TABS
650.0000 mg | ORAL_TABLET | Freq: Once | ORAL | Status: AC
  Administered 2020-03-26: 650 mg via ORAL
  Filled 2020-03-26: qty 2

## 2020-03-26 NOTE — Discharge Instructions (Addendum)
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
## 2020-03-26 NOTE — ED Notes (Signed)
Pt discharged ambulatory with family. All questions and concerns addressed. No complaints at this time.   
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
## 2021-05-04 ENCOUNTER — Encounter (HOSPITAL_COMMUNITY): Payer: Self-pay | Admitting: Emergency Medicine

## 2021-05-04 ENCOUNTER — Other Ambulatory Visit: Payer: Self-pay

## 2021-05-04 ENCOUNTER — Emergency Department (HOSPITAL_COMMUNITY)
Admission: EM | Admit: 2021-05-04 | Discharge: 2021-05-05 | Disposition: A | Discharge status: Home / Self Care | Payer: Self-pay | Attending: Emergency Medicine | Admitting: Emergency Medicine

## 2021-05-04 DIAGNOSIS — F17210 Nicotine dependence, cigarettes, uncomplicated: Secondary | ICD-10-CM | POA: Insufficient documentation

## 2021-05-04 DIAGNOSIS — H1032 Unspecified acute conjunctivitis, left eye: Secondary | ICD-10-CM | POA: Insufficient documentation

## 2021-05-04 NOTE — ED Triage Notes (Signed)
Pt reports left eye pain and redness that started 8 months ago. Pt states he was given a steroid that helped, "but it came right back."

## 2021-05-05 MED ORDER — FLUORESCEIN SODIUM 1 MG OP STRP
1.0000 | ORAL_STRIP | Freq: Once | OPHTHALMIC | Status: AC
  Administered 2021-05-05: 06:00:00 1 via OPHTHALMIC
  Filled 2021-05-05: qty 1

## 2021-05-05 MED ORDER — ERYTHROMYCIN 5 MG/GM OP OINT
1.0000 "application " | TOPICAL_OINTMENT | Freq: Once | OPHTHALMIC | Status: AC
  Administered 2021-05-05: 1 via OPHTHALMIC
  Filled 2021-05-05: qty 3.5

## 2021-05-05 MED ORDER — TETRACAINE HCL 0.5 % OP SOLN
2.0000 [drp] | Freq: Once | OPHTHALMIC | Status: AC
  Administered 2021-05-05: 06:00:00 2 [drp] via OPHTHALMIC
  Filled 2021-05-05: qty 4

## 2021-05-05 NOTE — ED Provider Notes (Signed)
[facility] EMERGENCY DEPARTMENT Provider Note   CSN: [id number] Arrival date & time: 05/04/21  1619     History Chief Complaint  Patient presents with   Eye Pain    Ryan Larsen is a 35 y.o. male.   Eye Pain This is a new problem. The current episode started more than 1 week ago. The problem occurs every several days. The problem has been gradually worsening. Pertinent negatives include no chest pain, no abdominal pain, no headaches and no shortness of breath. Nothing aggravates the symptoms. Nothing relieves the symptoms. He has tried nothing for the symptoms.      History reviewed. No pertinent past medical history.  Patient Active Problem List   Diagnosis Date Noted   Acute appendicitis 04/02/2013    Past Surgical History:  Procedure Laterality Date   APPENDECTOMY     HERNIA REPAIR     LAPAROSCOPIC APPENDECTOMY N/A 04/02/2013   Procedure: APPENDECTOMY LAPAROSCOPIC;  Surgeon: Dalia Heading, MD;  Location: [facility];  Service: General;  Laterality: N/A;       No family history on file.  Social History   Tobacco Use   Smoking status: Every Day    Packs/day: 1.00    Years: 3.00    Pack years: 3.00    Types: Cigarettes   Smokeless tobacco: Never  Substance Use Topics   Alcohol use: Yes    Comment: occassionally    Drug use: No    Home Medications Prior to Admission medications   Medication Sig Start Date End Date Taking? Authorizing Provider  amLODipine (NORVASC) 10 MG tablet Take 10 mg by mouth daily. 03/16/20   [provider]  cyclobenzaprine (FLEXERIL) 10 MG tablet Take 0.5-1 tablets (5-10 mg total) by mouth 2 (two) times daily as needed for muscle spasms. 03/26/20   Arthor Captain, PA-C  doxycycline (VIBRAMYCIN) 100 MG capsule Take 1 capsule (100 mg total) by mouth 2 (two) times daily. Patient not taking: Reported on 03/26/2020 11/18/14   Muthersbaugh, Dahlia Client, PA-C  meloxicam (MOBIC) 15 MG tablet Take 1 tablet (15 mg  total) by mouth daily. Take 1 daily with food. 03/26/20   Harris, Cammy Copa, PA-C  Omega-3 Fatty Acids (FISH OIL PO) Take 1 tablet by mouth daily.    [provider]    Allergies    Patient has no known allergies.  Review of Systems   Review of Systems  Eyes:  Positive for pain.  Respiratory:  Negative for shortness of breath.   Cardiovascular:  Negative for chest pain.  Gastrointestinal:  Negative for abdominal pain.  Neurological:  Negative for headaches.  All other systems reviewed and are negative.  Physical Exam Updated Vital Signs BP (!) 136/96 (BP Location: Right Arm)    Pulse 85    Temp 97.7 F (36.5 C) (Oral)    Resp 16    SpO2 99%   Physical Exam Vitals and nursing note reviewed.  Constitutional:      Appearance: He is well-developed.  HENT:     Head: Normocephalic and atraumatic.     Mouth/Throat:     Mouth: Mucous membranes are moist.     Pharynx: Oropharynx is clear.  Eyes:     General: Lids are normal.        Left eye: No foreign body, discharge or hordeolum.     Intraocular pressure: Left eye pressure is 15 mmHg. Measurements were taken using a handheld tonometer.    Extraocular Movements: Extraocular movements intact.  Right eye: Normal extraocular motion and no nystagmus.     Left eye: Normal extraocular motion and no nystagmus.     Conjunctiva/sclera:     Right eye: Right conjunctiva is not injected.     Left eye: Left conjunctiva is injected.     Pupils: Pupils are equal, round, and reactive to light.     Right eye: Pupil is not sluggish. No fluorescein uptake.     Left eye: Pupil is not sluggish. Fluorescein uptake present.  Cardiovascular:     Rate and Rhythm: Normal rate.  Pulmonary:     Effort: Pulmonary effort is normal. No respiratory distress.  Abdominal:     General: Abdomen is flat. There is no distension.  Musculoskeletal:        General: Normal range of motion.     Cervical back: Normal range of motion.  Skin:    General:  Skin is warm and dry.  Neurological:     General: No focal deficit present.     Mental Status: He is alert.    ED Results / Procedures / Treatments   Labs (all labs ordered are listed, but only abnormal results are displayed) Labs Reviewed - No data to display  EKG None  Radiology No results found.  Procedures Procedures   Medications Ordered in ED Medications  erythromycin ophthalmic ointment 1 application (has no administration in time range)  tetracaine (PONTOCAINE) 0.5 % ophthalmic solution 2 drop (2 drops Left Eye Given by Other 05/05/21 5520)  fluorescein ophthalmic strip 1 strip (1 strip Left Eye Given by Other 05/05/21 8022)    ED Course  I have reviewed the triage vital signs and the nursing notes.  Pertinent labs & imaging results that were available during my care of the patient were reviewed by me and considered in my medical decision making (see chart for details).    MDM Rules/Calculators/A&P                         No e/o acute angle closure glaucoma.   Possibly small corneal ulcer on medial side of left pupil around 0800 position. Will treat for same. Ophthalmology follow up.   Final Clinical Impression(s) / ED Diagnoses Final diagnoses:  Acute conjunctivitis of left eye, unspecified acute conjunctivitis type    Rx / DC Orders ED Discharge Orders          Ordered    Ambulatory referral to Ophthalmology        05/05/21 0627             Earnesteen Birnie, Barbara Cower, MD 05/05/21 [phone]

## 2022-06-28 ENCOUNTER — Other Ambulatory Visit: Payer: Self-pay

## 2022-06-28 ENCOUNTER — Encounter (HOSPITAL_COMMUNITY): Payer: Self-pay

## 2022-06-28 ENCOUNTER — Emergency Department (HOSPITAL_COMMUNITY)
Admission: EM | Admit: 2022-06-28 | Discharge: 2022-06-28 | Disposition: A | Discharge status: Home / Self Care | Payer: Self-pay | Attending: Emergency Medicine | Admitting: Emergency Medicine

## 2022-06-28 DIAGNOSIS — S81852A Open bite, left lower leg, initial encounter: Secondary | ICD-10-CM | POA: Insufficient documentation

## 2022-06-28 DIAGNOSIS — Z23 Encounter for immunization: Secondary | ICD-10-CM | POA: Insufficient documentation

## 2022-06-28 DIAGNOSIS — W540XXA Bitten by dog, initial encounter: Secondary | ICD-10-CM | POA: Insufficient documentation

## 2022-06-28 MED ORDER — AMOXICILLIN-POT CLAVULANATE 875-125 MG PO TABS: 1.0000 | ORAL_TABLET | Freq: Two times a day (BID) | ORAL | 0 refills | Status: AC

## 2022-06-28 MED ORDER — TETANUS-DIPHTH-ACELL PERTUSSIS 5-2.5-18.5 LF-MCG/0.5 IM SUSY
0.5000 mL | PREFILLED_SYRINGE | Freq: Once | INTRAMUSCULAR | Status: AC
  Administered 2022-06-28: 0.5 mL via INTRAMUSCULAR
  Filled 2022-06-28: qty 0.5

## 2022-06-28 MED ORDER — AMOXICILLIN-POT CLAVULANATE 875-125 MG PO TABS
1.0000 | ORAL_TABLET | Freq: Once | ORAL | Status: AC
  Administered 2022-06-28: 1 via ORAL
  Filled 2022-06-28: qty 1

## 2022-06-28 NOTE — Discharge Instructions (Addendum)
Return to the ED with any new or worsening signs or symptoms Please follow-up with your PCP in 1 week for reevaluation of left leg wound Please take Augmentin twice daily for the next 7 days.  You have received your first dose here in the department tonight.  You have 13 remaining doses. Please read attached guide concerning animal bites
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
## 2022-06-28 NOTE — ED Provider Notes (Signed)
[facility] Provider Note   CSN: [id number] Arrival date & time: 06/28/22  1849     History  Chief Complaint  Patient presents with   Animal Bite    Ryan Larsen is a 37 y.o. male with medical history of appendectomy, hernia repair.  Patient presents to ED for evaluation of left leg wound.  Patient reports that prior to arrival he was bitten by a dog owned by a couple at a bar he was at.  The patient reports that per the couple, the dog is up-to-date on all of his vaccinations.  Patient unsure of last tetanus update.   Animal Bite      Home Medications Prior to Admission medications   Medication Sig Start Date End Date Taking? Authorizing Provider  amoxicillin-clavulanate (AUGMENTIN) 875-125 MG tablet Take 1 tablet by mouth every 12 (twelve) hours. 06/28/22  Yes Azucena Cecil, PA-C  amLODipine (NORVASC) 10 MG tablet Take 10 mg by mouth daily. 03/16/20   [provider]  cyclobenzaprine (FLEXERIL) 10 MG tablet Take 0.5-1 tablets (5-10 mg total) by mouth 2 (two) times daily as needed for muscle spasms. 03/26/20   Margarita Mail, PA-C  doxycycline (VIBRAMYCIN) 100 MG capsule Take 1 capsule (100 mg total) by mouth 2 (two) times daily. Patient not taking: Reported on 03/26/2020 11/18/14   Muthersbaugh, Jarrett Soho, PA-C  meloxicam (MOBIC) 15 MG tablet Take 1 tablet (15 mg total) by mouth daily. Take 1 daily with food. 03/26/20   Harris, Vernie Shanks, PA-C  Omega-3 Fatty Acids (FISH OIL PO) Take 1 tablet by mouth daily.    [provider]      Allergies    Patient has no known allergies.    Review of Systems   Review of Systems  Skin:  Positive for wound.  All other systems reviewed and are negative.   Physical Exam Updated Vital Signs BP (!) 149/108 (BP Location: Right Arm)   Pulse (!) 116   Temp 98.7 F (37.1 C) (Oral)   Resp 16   Ht 6' (1.829 m)   Wt 95.3 kg   SpO2 96%   BMI 28.48 kg/m  Physical  Exam Vitals and nursing note reviewed.  Constitutional:      General: He is not in acute distress.    Appearance: He is well-developed.  HENT:     Head: Normocephalic and atraumatic.  Eyes:     Conjunctiva/sclera: Conjunctivae normal.  Cardiovascular:     Rate and Rhythm: Normal rate and regular rhythm.     Heart sounds: No murmur heard. Pulmonary:     Effort: Pulmonary effort is normal. No respiratory distress.     Breath sounds: Normal breath sounds.  Abdominal:     Palpations: Abdomen is soft.     Tenderness: There is no abdominal tenderness.  Musculoskeletal:        General: No swelling.     Cervical back: Neck supple.  Skin:    General: Skin is warm and dry.     Capillary Refill: Capillary refill takes less than 2 seconds.     Comments: Superficial abrasion consistent with dog bite to lateral left upper leg.  Please see picture.  Neurological:     Mental Status: He is alert.  Psychiatric:        Mood and Affect: Mood normal.     ED Results / Procedures / Treatments   Labs (all labs ordered are listed, but only abnormal results are displayed)  Labs Reviewed - No data to display  EKG None  Radiology No results found.  Procedures Procedures   Medications Ordered in ED Medications  amoxicillin-clavulanate (AUGMENTIN) 875-125 MG per tablet 1 tablet (has no administration in time range)  Tdap (BOOSTRIX) injection 0.5 mL (0.5 mLs Intramuscular Given 06/28/22 1929)    ED Course/ Medical Decision Making/ A&P  Medical Decision Making Risk Prescription drug management.   37 year old male presents to the ED for evaluation.  Please see HPI for further details.  On examination the patient has a superficial abrasion to left lateral leg.  No bleeding.  No laceration.  Patient reports that per the couple who over the dog, the dog is up-to-date on his vaccinations.  Patient Tdap updated as the patient reports he is unsure of his last update.  Patient provided initial  dose of Augmentin here.  Patient will be sent home with 7 days of Augmentin twice daily.  Patient advised to follow-up with PCP for reevaluation.  Patient encouraged to return to the ED with any new or worsening signs or symptoms.  Patient voiced understanding with instructions.  Patient had all of his questions answered to his satisfaction.  Patient stable for discharge.   Final Clinical Impression(s) / ED Diagnoses Final diagnoses:  Dog bite, initial encounter    Rx / DC Orders ED Discharge Orders          Ordered    amoxicillin-clavulanate (AUGMENTIN) 875-125 MG tablet  Every 12 hours        06/28/22 1931              Lawana Chambers 06/28/22 1932    Tegeler, Gwenyth Allegra, MD 06/28/22 [phone]

## 2022-06-28 NOTE — ED Notes (Signed)
Pt A&OX4 ambulatory at d/c with independent steady gait. Pt verbalized understanding of d/c instructions, prescription and follow up care. 
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
